# Patient Record
Sex: Female | Born: 1955 | Race: Black or African American | Hispanic: No | Marital: Married | Smoking: Current every day smoker
Health system: Southern US, Community
[De-identification: ages and names within clinical notes are randomized; demographics above are authoritative.]

## PROBLEM LIST (undated history)

## (undated) DIAGNOSIS — C801 Malignant (primary) neoplasm, unspecified: Secondary | ICD-10-CM

## (undated) DIAGNOSIS — C539 Malignant neoplasm of cervix uteri, unspecified: Secondary | ICD-10-CM

## (undated) DIAGNOSIS — R569 Unspecified convulsions: Secondary | ICD-10-CM

## (undated) DIAGNOSIS — I509 Heart failure, unspecified: Secondary | ICD-10-CM

## (undated) DIAGNOSIS — E785 Hyperlipidemia, unspecified: Secondary | ICD-10-CM

## (undated) DIAGNOSIS — I5181 Takotsubo syndrome: Secondary | ICD-10-CM

## (undated) DIAGNOSIS — C140 Malignant neoplasm of pharynx, unspecified: Secondary | ICD-10-CM

## (undated) DIAGNOSIS — F1010 Alcohol abuse, uncomplicated: Secondary | ICD-10-CM

## (undated) DIAGNOSIS — I639 Cerebral infarction, unspecified: Secondary | ICD-10-CM

## (undated) DIAGNOSIS — A0472 Enterocolitis due to Clostridium difficile, not specified as recurrent: Secondary | ICD-10-CM

## (undated) HISTORY — PX: CORONARY ARTERY BYPASS GRAFT: SHX141

---

## 2005-11-26 ENCOUNTER — Emergency Department: Payer: Self-pay | Admitting: Unknown Physician Specialty

## 2006-04-19 ENCOUNTER — Emergency Department: Payer: Self-pay | Admitting: Emergency Medicine

## 2008-02-22 ENCOUNTER — Other Ambulatory Visit: Payer: Self-pay

## 2008-02-22 ENCOUNTER — Inpatient Hospital Stay: Payer: Self-pay | Admitting: Internal Medicine

## 2008-02-28 ENCOUNTER — Other Ambulatory Visit: Payer: Self-pay

## 2008-03-14 ENCOUNTER — Other Ambulatory Visit: Payer: Self-pay

## 2008-03-15 ENCOUNTER — Inpatient Hospital Stay: Payer: Self-pay | Admitting: Cardiovascular Disease

## 2014-08-16 ENCOUNTER — Inpatient Hospital Stay: Payer: Self-pay | Admitting: Internal Medicine

## 2014-08-16 LAB — CBC WITH DIFFERENTIAL/PLATELET
BASOS ABS: 0.1 10*3/uL (ref 0.0–0.1)
Basophil %: 1.7 %
EOS ABS: 0 10*3/uL (ref 0.0–0.7)
Eosinophil %: 1.1 %
HCT: 36.5 % (ref 35.0–47.0)
HGB: 11.9 g/dL — ABNORMAL LOW (ref 12.0–16.0)
Lymphocyte #: 0.8 10*3/uL — ABNORMAL LOW (ref 1.0–3.6)
Lymphocyte %: 19.2 %
MCH: 35 pg — ABNORMAL HIGH (ref 26.0–34.0)
MCHC: 32.8 g/dL (ref 32.0–36.0)
MCV: 107 fL — AB (ref 80–100)
MONOS PCT: 8.9 %
Monocyte #: 0.4 x10 3/mm (ref 0.2–0.9)
Neutrophil #: 3 10*3/uL (ref 1.4–6.5)
Neutrophil %: 69.1 %
Platelet: 300 10*3/uL (ref 150–440)
RBC: 3.41 10*6/uL — AB (ref 3.80–5.20)
RDW: 14.6 % — ABNORMAL HIGH (ref 11.5–14.5)
WBC: 4.3 10*3/uL (ref 3.6–11.0)

## 2014-08-16 LAB — BASIC METABOLIC PANEL
ANION GAP: 10 (ref 7–16)
BUN: 11 mg/dL (ref 7–18)
CALCIUM: 8.9 mg/dL (ref 8.5–10.1)
Chloride: 105 mmol/L (ref 98–107)
Co2: 28 mmol/L (ref 21–32)
Creatinine: 1.06 mg/dL (ref 0.60–1.30)
EGFR (Non-African Amer.): 57 — ABNORMAL LOW
Glucose: 85 mg/dL (ref 65–99)
Osmolality: 284 (ref 275–301)
POTASSIUM: 4.1 mmol/L (ref 3.5–5.1)
Sodium: 143 mmol/L (ref 136–145)

## 2014-08-16 LAB — ETHANOL: Ethanol: 332 mg/dL

## 2014-08-16 LAB — PROTIME-INR
INR: 0.9
Prothrombin Time: 12.4 secs (ref 11.5–14.7)

## 2014-08-16 LAB — APTT: ACTIVATED PTT: 28.3 s (ref 23.6–35.9)

## 2014-08-17 LAB — CBC WITH DIFFERENTIAL/PLATELET
BASOS PCT: 0.9 %
Basophil #: 0.1 10*3/uL (ref 0.0–0.1)
Eosinophil #: 0 10*3/uL (ref 0.0–0.7)
Eosinophil %: 0.3 %
HCT: 31.1 % — AB (ref 35.0–47.0)
HGB: 10.5 g/dL — ABNORMAL LOW (ref 12.0–16.0)
LYMPHS ABS: 0.6 10*3/uL — AB (ref 1.0–3.6)
Lymphocyte %: 9.9 %
MCH: 35.9 pg — ABNORMAL HIGH (ref 26.0–34.0)
MCHC: 33.8 g/dL (ref 32.0–36.0)
MCV: 106 fL — ABNORMAL HIGH (ref 80–100)
Monocyte #: 0.7 x10 3/mm (ref 0.2–0.9)
Monocyte %: 11.2 %
Neutrophil #: 5 10*3/uL (ref 1.4–6.5)
Neutrophil %: 77.7 %
Platelet: 251 10*3/uL (ref 150–440)
RBC: 2.93 10*6/uL — ABNORMAL LOW (ref 3.80–5.20)
RDW: 14.8 % — AB (ref 11.5–14.5)
WBC: 6.4 10*3/uL (ref 3.6–11.0)

## 2014-08-17 LAB — BASIC METABOLIC PANEL
Anion Gap: 13 (ref 7–16)
BUN: 10 mg/dL (ref 7–18)
CHLORIDE: 107 mmol/L (ref 98–107)
CREATININE: 0.86 mg/dL (ref 0.60–1.30)
Calcium, Total: 8.3 mg/dL — ABNORMAL LOW (ref 8.5–10.1)
Co2: 24 mmol/L (ref 21–32)
EGFR (African American): >60
Glucose: 64 mg/dL — ABNORMAL LOW (ref 65–99)
Osmolality: 284 (ref 275–301)
POTASSIUM: 3.8 mmol/L (ref 3.5–5.1)
Sodium: 144 mmol/L (ref 136–145)

## 2014-08-17 LAB — HEPATIC FUNCTION PANEL A (ARMC)
AST: 33 U/L (ref 15–37)
Albumin: 3.3 g/dL — ABNORMAL LOW (ref 3.4–5.0)
Alkaline Phosphatase: 62 U/L
Bilirubin, Direct: 0.1 mg/dL (ref 0.0–0.2)
Bilirubin,Total: 0.7 mg/dL (ref 0.2–1.0)
SGPT (ALT): 22 U/L
Total Protein: 7.4 g/dL (ref 6.4–8.2)

## 2014-08-18 LAB — CBC WITH DIFFERENTIAL/PLATELET
BASOS ABS: 0 10*3/uL (ref 0.0–0.1)
Basophil %: 0.4 %
EOS ABS: 0 10*3/uL (ref 0.0–0.7)
Eosinophil %: 0.1 %
HCT: 28.3 % — ABNORMAL LOW (ref 35.0–47.0)
HGB: 9.7 g/dL — ABNORMAL LOW (ref 12.0–16.0)
Lymphocyte #: 0.5 10*3/uL — ABNORMAL LOW (ref 1.0–3.6)
Lymphocyte %: 7.9 %
MCH: 35.9 pg — AB (ref 26.0–34.0)
MCHC: 34.1 g/dL (ref 32.0–36.0)
MCV: 105 fL — AB (ref 80–100)
Monocyte #: 0.6 x10 3/mm (ref 0.2–0.9)
Monocyte %: 9.4 %
NEUTROS ABS: 5.3 10*3/uL (ref 1.4–6.5)
Neutrophil %: 82.2 %
Platelet: 193 10*3/uL (ref 150–440)
RBC: 2.69 10*6/uL — ABNORMAL LOW (ref 3.80–5.20)
RDW: 14.5 % (ref 11.5–14.5)
WBC: 6.4 10*3/uL (ref 3.6–11.0)

## 2014-08-18 LAB — BASIC METABOLIC PANEL
Anion Gap: 9 (ref 7–16)
BUN: 9 mg/dL (ref 7–18)
Calcium, Total: 8.4 mg/dL — ABNORMAL LOW (ref 8.5–10.1)
Chloride: 97 mmol/L — ABNORMAL LOW (ref 98–107)
Co2: 29 mmol/L (ref 21–32)
Creatinine: 1.13 mg/dL (ref 0.60–1.30)
EGFR (African American): >60
EGFR (Non-African Amer.): 53 — ABNORMAL LOW
GLUCOSE: 89 mg/dL (ref 65–99)
Osmolality: 268 (ref 275–301)
Potassium: 3.6 mmol/L (ref 3.5–5.1)
SODIUM: 135 mmol/L — AB (ref 136–145)

## 2014-08-19 LAB — BASIC METABOLIC PANEL
ANION GAP: 11 (ref 7–16)
BUN: 7 mg/dL (ref 7–18)
CALCIUM: 8.4 mg/dL — AB (ref 8.5–10.1)
Chloride: 96 mmol/L — ABNORMAL LOW (ref 98–107)
Co2: 27 mmol/L (ref 21–32)
Creatinine: 0.74 mg/dL (ref 0.60–1.30)
EGFR (African American): >60
GLUCOSE: 73 mg/dL (ref 65–99)
OSMOLALITY: 265 (ref 275–301)
POTASSIUM: 3.5 mmol/L (ref 3.5–5.1)
SODIUM: 134 mmol/L — AB (ref 136–145)

## 2014-08-19 LAB — HEMOGLOBIN: HGB: 10 g/dL — AB (ref 12.0–16.0)

## 2014-08-19 LAB — PHOSPHORUS: PHOSPHORUS: 1.7 mg/dL — AB (ref 2.5–4.9)

## 2014-08-19 LAB — MAGNESIUM: MAGNESIUM: 1.6 mg/dL — AB

## 2014-08-20 LAB — MAGNESIUM: MAGNESIUM: 1.7 mg/dL — AB

## 2014-08-20 LAB — PHOSPHORUS: Phosphorus: 3.1 mg/dL (ref 2.5–4.9)

## 2014-08-21 LAB — MAGNESIUM: MAGNESIUM: 1.8 mg/dL

## 2014-10-19 ENCOUNTER — Inpatient Hospital Stay: Payer: Self-pay | Admitting: Internal Medicine

## 2014-10-19 DIAGNOSIS — I517 Cardiomegaly: Secondary | ICD-10-CM

## 2014-10-19 LAB — CBC
HCT: 35.1 % (ref 35.0–47.0)
HGB: 11.6 g/dL — AB (ref 12.0–16.0)
MCH: 32.3 pg (ref 26.0–34.0)
MCHC: 33 g/dL (ref 32.0–36.0)
MCV: 98 fL (ref 80–100)
Platelet: 230 10*3/uL (ref 150–440)
RBC: 3.58 10*6/uL — AB (ref 3.80–5.20)
RDW: 15.6 % — ABNORMAL HIGH (ref 11.5–14.5)
WBC: 4.3 10*3/uL (ref 3.6–11.0)

## 2014-10-19 LAB — BASIC METABOLIC PANEL
Anion Gap: 11 (ref 7–16)
BUN: 12 mg/dL (ref 7–18)
Calcium, Total: 9 mg/dL (ref 8.5–10.1)
Chloride: 98 mmol/L (ref 98–107)
Co2: 25 mmol/L (ref 21–32)
Creatinine: 1.18 mg/dL (ref 0.60–1.30)
EGFR (African American): >60
GFR CALC NON AF AMER: 50 — AB
Glucose: 80 mg/dL (ref 65–99)
Osmolality: 267 (ref 275–301)
Potassium: 3.8 mmol/L (ref 3.5–5.1)
Sodium: 134 mmol/L — ABNORMAL LOW (ref 136–145)

## 2014-10-19 LAB — HEPATIC FUNCTION PANEL A (ARMC)
Albumin: 3.5 g/dL (ref 3.4–5.0)
Alkaline Phosphatase: 70 U/L
BILIRUBIN DIRECT: 0.1 mg/dL (ref 0.0–0.2)
BILIRUBIN TOTAL: 0.6 mg/dL (ref 0.2–1.0)
SGOT(AST): 37 U/L (ref 15–37)
SGPT (ALT): 21 U/L
Total Protein: 7.3 g/dL (ref 6.4–8.2)

## 2014-10-19 LAB — ETHANOL: Ethanol: <3 mg/dL

## 2014-10-19 LAB — MAGNESIUM: MAGNESIUM: 1.4 mg/dL — AB

## 2014-10-20 ENCOUNTER — Other Ambulatory Visit: Payer: Self-pay | Admitting: Physician Assistant

## 2014-10-20 DIAGNOSIS — E876 Hypokalemia: Secondary | ICD-10-CM

## 2014-10-20 DIAGNOSIS — I639 Cerebral infarction, unspecified: Secondary | ICD-10-CM

## 2014-10-20 DIAGNOSIS — I10 Essential (primary) hypertension: Secondary | ICD-10-CM

## 2014-10-20 LAB — LIPID PANEL
CHOLESTEROL: 189 mg/dL (ref 0–200)
HDL Cholesterol: 106 mg/dL — ABNORMAL HIGH (ref 40–60)
LDL CHOLESTEROL, CALC: 70 mg/dL (ref 0–100)
Triglycerides: 67 mg/dL (ref 0–200)
VLDL Cholesterol, Calc: 13 mg/dL (ref 5–40)

## 2014-10-20 LAB — TSH: Thyroid Stimulating Horm: 3.67 u[IU]/mL

## 2014-10-20 LAB — HEMOGLOBIN A1C: Hemoglobin A1C: 4.8 % (ref 4.2–6.3)

## 2014-10-20 LAB — MAGNESIUM: MAGNESIUM: 2.2 mg/dL

## 2014-10-21 LAB — BASIC METABOLIC PANEL
ANION GAP: 8 (ref 7–16)
BUN: 5 mg/dL — AB (ref 7–18)
CREATININE: 0.78 mg/dL (ref 0.60–1.30)
Calcium, Total: 8.9 mg/dL (ref 8.5–10.1)
Chloride: 104 mmol/L (ref 98–107)
Co2: 27 mmol/L (ref 21–32)
EGFR (African American): >60
EGFR (Non-African Amer.): >60
Glucose: 96 mg/dL (ref 65–99)
Osmolality: 275 (ref 275–301)
Potassium: 3.6 mmol/L (ref 3.5–5.1)
Sodium: 139 mmol/L (ref 136–145)

## 2014-10-21 LAB — HEMOGLOBIN: HGB: 10.7 g/dL — AB (ref 12.0–16.0)

## 2014-10-26 ENCOUNTER — Encounter: Payer: Self-pay | Admitting: Cardiovascular Disease

## 2014-10-27 ENCOUNTER — Encounter: Payer: Medicare Other | Admitting: Cardiovascular Disease

## 2014-11-13 ENCOUNTER — Ambulatory Visit: Payer: Self-pay | Admitting: Internal Medicine

## 2014-11-17 ENCOUNTER — Encounter: Payer: Self-pay | Admitting: *Deleted

## 2014-11-17 ENCOUNTER — Encounter: Payer: Medicare Other | Admitting: Cardiovascular Disease

## 2014-11-20 ENCOUNTER — Inpatient Hospital Stay: Payer: Self-pay | Admitting: Internal Medicine

## 2014-11-20 LAB — CBC
HCT: 38 % (ref 35.0–47.0)
HGB: 12.1 g/dL (ref 12.0–16.0)
MCH: 30.4 pg (ref 26.0–34.0)
MCHC: 31.9 g/dL — ABNORMAL LOW (ref 32.0–36.0)
MCV: 95 fL (ref 80–100)
Platelet: 292 10*3/uL (ref 150–440)
RBC: 3.98 10*6/uL (ref 3.80–5.20)
RDW: 16.9 % — ABNORMAL HIGH (ref 11.5–14.5)
WBC: 13.2 10*3/uL — ABNORMAL HIGH (ref 3.6–11.0)

## 2014-11-20 LAB — URINALYSIS, COMPLETE
BACTERIA: NONE SEEN
Bilirubin,UR: NEGATIVE
GLUCOSE, UR: NEGATIVE mg/dL (ref 0–75)
Hyaline Cast: 8
Ketone: NEGATIVE
LEUKOCYTE ESTERASE: NEGATIVE
NITRITE: NEGATIVE
Ph: 5 (ref 4.5–8.0)
Protein: 100
RBC,UR: 1 /HPF (ref 0–5)
SPECIFIC GRAVITY: 1.014 (ref 1.003–1.030)

## 2014-11-20 LAB — COMPREHENSIVE METABOLIC PANEL
ALT: 14 U/L (ref 14–63)
AST: 34 U/L (ref 15–37)
Albumin: 3.4 g/dL (ref 3.4–5.0)
Alkaline Phosphatase: 77 U/L (ref 46–116)
Anion Gap: 12 (ref 7–16)
BILIRUBIN TOTAL: 0.5 mg/dL (ref 0.2–1.0)
BUN: 14 mg/dL (ref 7–18)
CHLORIDE: 100 mmol/L (ref 98–107)
CREATININE: 1.42 mg/dL — AB (ref 0.60–1.30)
Calcium, Total: 9.1 mg/dL (ref 8.5–10.1)
Co2: 25 mmol/L (ref 21–32)
EGFR (African American): 49 — ABNORMAL LOW
GFR CALC NON AF AMER: 41 — AB
Glucose: 170 mg/dL — ABNORMAL HIGH (ref 65–99)
Osmolality: 278 (ref 275–301)
Potassium: 3.3 mmol/L — ABNORMAL LOW (ref 3.5–5.1)
Sodium: 137 mmol/L (ref 136–145)
Total Protein: 8.2 g/dL (ref 6.4–8.2)

## 2014-11-20 LAB — DRUG SCREEN, URINE
Amphetamines, Ur Screen: NEGATIVE (ref ?–1000)
Barbiturates, Ur Screen: NEGATIVE (ref ?–200)
Benzodiazepine, Ur Scrn: NEGATIVE (ref ?–200)
Cannabinoid 50 Ng, Ur ~~LOC~~: NEGATIVE (ref ?–50)
Cocaine Metabolite,Ur ~~LOC~~: NEGATIVE (ref ?–300)
MDMA (ECSTASY) UR SCREEN: NEGATIVE (ref ?–500)
Methadone, Ur Screen: NEGATIVE (ref ?–300)
Opiate, Ur Screen: NEGATIVE (ref ?–300)
Phencyclidine (PCP) Ur S: NEGATIVE (ref ?–25)
Tricyclic, Ur Screen: NEGATIVE (ref ?–1000)

## 2014-11-20 LAB — ACETAMINOPHEN LEVEL: Acetaminophen: <2 ug/mL

## 2014-11-20 LAB — PROTIME-INR
INR: 1
PROTHROMBIN TIME: 13.7 s

## 2014-11-20 LAB — SALICYLATE LEVEL: Salicylates, Serum: 6.5 mg/dL — ABNORMAL HIGH

## 2014-11-20 LAB — ETHANOL

## 2014-11-20 LAB — MAGNESIUM: Magnesium: 1.6 mg/dL — ABNORMAL LOW

## 2014-11-20 LAB — TROPONIN I: TROPONIN-I: 0.04 ng/mL

## 2014-11-22 DIAGNOSIS — I340 Nonrheumatic mitral (valve) insufficiency: Secondary | ICD-10-CM

## 2014-11-24 ENCOUNTER — Other Ambulatory Visit (HOSPITAL_COMMUNITY): Payer: Self-pay

## 2014-11-24 ENCOUNTER — Inpatient Hospital Stay
Admission: AD | Admit: 2014-11-24 | Discharge: 2015-01-01 | Disposition: A | Discharge status: Skilled Nursing Facility | Payer: Self-pay | Source: Ambulatory Visit | Attending: Internal Medicine | Admitting: Internal Medicine

## 2014-11-24 ENCOUNTER — Ambulatory Visit (HOSPITAL_COMMUNITY)
Admission: AD | Admit: 2014-11-24 | Discharge: 2014-11-24 | Disposition: A | Discharge status: Home / Self Care | Payer: Medicare Other | Source: Other Acute Inpatient Hospital | Attending: Internal Medicine | Admitting: Internal Medicine

## 2014-11-24 DIAGNOSIS — Z978 Presence of other specified devices: Secondary | ICD-10-CM

## 2014-11-24 DIAGNOSIS — Z9911 Dependence on respirator [ventilator] status: Secondary | ICD-10-CM | POA: Diagnosis not present

## 2014-11-24 DIAGNOSIS — Z01818 Encounter for other preprocedural examination: Secondary | ICD-10-CM

## 2014-11-24 DIAGNOSIS — J9601 Acute respiratory failure with hypoxia: Secondary | ICD-10-CM

## 2014-11-24 DIAGNOSIS — Z931 Gastrostomy status: Secondary | ICD-10-CM

## 2014-11-24 DIAGNOSIS — K639 Disease of intestine, unspecified: Secondary | ICD-10-CM

## 2014-11-24 DIAGNOSIS — K6389 Other specified diseases of intestine: Secondary | ICD-10-CM

## 2014-11-24 DIAGNOSIS — Z431 Encounter for attention to gastrostomy: Secondary | ICD-10-CM

## 2014-11-24 DIAGNOSIS — J9690 Respiratory failure, unspecified, unspecified whether with hypoxia or hypercapnia: Secondary | ICD-10-CM | POA: Diagnosis present

## 2014-11-24 DIAGNOSIS — Z430 Encounter for attention to tracheostomy: Secondary | ICD-10-CM

## 2014-11-24 DIAGNOSIS — R1310 Dysphagia, unspecified: Secondary | ICD-10-CM | POA: Insufficient documentation

## 2014-11-24 HISTORY — DX: Heart failure, unspecified: I50.9

## 2014-11-24 HISTORY — DX: Alcohol abuse, uncomplicated: F10.10

## 2014-11-24 HISTORY — DX: Cerebral infarction, unspecified: I63.9

## 2014-11-24 HISTORY — DX: Takotsubo syndrome: I51.81

## 2014-11-25 LAB — CBC
HEMATOCRIT: 26.4 % — AB (ref 36.0–46.0)
Hemoglobin: 9.4 g/dL — ABNORMAL LOW (ref 12.0–15.0)
MCH: 30.5 pg (ref 26.0–34.0)
MCHC: 35.6 g/dL (ref 30.0–36.0)
MCV: 85.7 fL (ref 78.0–100.0)
Platelets: 181 10*3/uL (ref 150–400)
RBC: 3.08 MIL/uL — ABNORMAL LOW (ref 3.87–5.11)
RDW: 16.2 % — ABNORMAL HIGH (ref 11.5–15.5)
WBC: 8.2 10*3/uL (ref 4.0–10.5)

## 2014-11-25 LAB — TSH: TSH: 1.833 u[IU]/mL (ref 0.350–4.500)

## 2014-11-25 LAB — BLOOD GAS, ARTERIAL
ACID-BASE EXCESS: 4 mmol/L — AB (ref 0.0–2.0)
Acid-Base Excess: 2.4 mmol/L — ABNORMAL HIGH (ref 0.0–2.0)
Acid-Base Excess: 2.8 mmol/L — ABNORMAL HIGH (ref 0.0–2.0)
BICARBONATE: 25.8 meq/L — AB (ref 20.0–24.0)
Bicarbonate: 25.5 mEq/L — ABNORMAL HIGH (ref 20.0–24.0)
Bicarbonate: 27.2 mEq/L — ABNORMAL HIGH (ref 20.0–24.0)
FIO2: 0.3 %
FIO2: 0.3 %
FIO2: 0.3 %
MECHVT: 450 mL
MECHVT: 400 mL
O2 SAT: 97 %
O2 SAT: 99 %
O2 Saturation: 82.4 %
PCO2 ART: 35.4 mmHg (ref 35.0–45.0)
PCO2 ART: 36.8 mmHg (ref 35.0–45.0)
PEEP/CPAP: 5 cmH2O
PEEP/CPAP: 5 cmH2O
PEEP: 5 cmH2O
PH ART: 7.529 — AB (ref 7.350–7.450)
Patient temperature: 98.6
Patient temperature: 98.7
Patient temperature: 100
RATE: 15 resp/min
RATE: 15 resp/min
RATE: 15 resp/min
TCO2: 26.9 mmol/L (ref 0–100)
TCO2: 26.5 mmol/L (ref 0–100)
TCO2: 28.3 mmol/L (ref 0–100)
VT: 450 mL
pCO2 arterial: 30.8 mmHg — ABNORMAL LOW (ref 35.0–45.0)
pH, Arterial: 7.476 — ABNORMAL HIGH (ref 7.350–7.450)
pH, Arterial: 7.486 — ABNORMAL HIGH (ref 7.350–7.450)
pO2, Arterial: 48.4 mmHg — ABNORMAL LOW (ref 80.0–100.0)
pO2, Arterial: 83.5 mmHg (ref 80.0–100.0)
pO2, Arterial: 97.8 mmHg (ref 80.0–100.0)

## 2014-11-25 LAB — CBC WITH DIFFERENTIAL/PLATELET
BASOS PCT: 0 % (ref 0–1)
Basophils Absolute: 0 10*3/uL (ref 0.0–0.1)
EOS ABS: 0 10*3/uL (ref 0.0–0.7)
Eosinophils Relative: 1 % (ref 0–5)
HEMATOCRIT: 27.3 % — AB (ref 36.0–46.0)
Hemoglobin: 9.7 g/dL — ABNORMAL LOW (ref 12.0–15.0)
LYMPHS ABS: 0.7 10*3/uL (ref 0.7–4.0)
LYMPHS PCT: 9 % — AB (ref 12–46)
MCH: 30.3 pg (ref 26.0–34.0)
MCHC: 35.5 g/dL (ref 30.0–36.0)
MCV: 85.3 fL (ref 78.0–100.0)
MONO ABS: 1.6 10*3/uL — AB (ref 0.1–1.0)
Monocytes Relative: 19 % — ABNORMAL HIGH (ref 3–12)
NEUTROS PCT: 71 % (ref 43–77)
Neutro Abs: 5.9 10*3/uL (ref 1.7–7.7)
PLATELETS: 197 10*3/uL (ref 150–400)
RBC: 3.2 MIL/uL — ABNORMAL LOW (ref 3.87–5.11)
RDW: 15.8 % — ABNORMAL HIGH (ref 11.5–15.5)
WBC: 8.2 10*3/uL (ref 4.0–10.5)

## 2014-11-25 LAB — BASIC METABOLIC PANEL
ANION GAP: 9 (ref 5–15)
ANION GAP: 12 (ref 5–15)
BUN: 7 mg/dL (ref 6–23)
BUN: <5 mg/dL — ABNORMAL LOW (ref 6–23)
CO2: 25 mmol/L (ref 19–32)
CO2: 24 mmol/L (ref 19–32)
CREATININE: 0.9 mg/dL (ref 0.50–1.10)
Calcium: 7.9 mg/dL — ABNORMAL LOW (ref 8.4–10.5)
Calcium: 8.5 mg/dL (ref 8.4–10.5)
Chloride: 99 mmol/L (ref 96–112)
Chloride: 99 mmol/L (ref 96–112)
Creatinine, Ser: 0.95 mg/dL (ref 0.50–1.10)
GFR calc Af Amer: 76 mL/min — ABNORMAL LOW (ref >90)
GFR calc Af Amer: 81 mL/min — ABNORMAL LOW (ref >90)
GFR, EST NON AFRICAN AMERICAN: 66 mL/min — AB (ref >90)
GFR, EST NON AFRICAN AMERICAN: 70 mL/min — AB (ref >90)
GLUCOSE: 114 mg/dL — AB (ref 70–99)
Glucose, Bld: 118 mg/dL — ABNORMAL HIGH (ref 70–99)
POTASSIUM: 3.7 mmol/L (ref 3.5–5.1)
Potassium: 3.4 mmol/L — ABNORMAL LOW (ref 3.5–5.1)
SODIUM: 135 mmol/L (ref 135–145)
Sodium: 133 mmol/L — ABNORMAL LOW (ref 135–145)

## 2014-11-25 LAB — HEPATIC FUNCTION PANEL
ALBUMIN: 2 g/dL — AB (ref 3.5–5.2)
ALT: 10 U/L (ref 0–35)
AST: 26 U/L (ref 0–37)
Alkaline Phosphatase: 49 U/L (ref 39–117)
BILIRUBIN DIRECT: 0.2 mg/dL (ref 0.0–0.5)
BILIRUBIN INDIRECT: 0.5 mg/dL (ref 0.3–0.9)
Total Bilirubin: 0.7 mg/dL (ref 0.3–1.2)
Total Protein: 5.3 g/dL — ABNORMAL LOW (ref 6.0–8.3)

## 2014-11-25 LAB — LACTIC ACID, PLASMA: Lactic Acid, Venous: 1.8 mmol/L (ref 0.5–2.0)

## 2014-11-25 LAB — BRAIN NATRIURETIC PEPTIDE: B NATRIURETIC PEPTIDE 5: 1791.9 pg/mL — AB (ref 0.0–100.0)

## 2014-11-25 LAB — PROCALCITONIN: PROCALCITONIN: 0.57 ng/mL

## 2014-11-26 ENCOUNTER — Other Ambulatory Visit (HOSPITAL_COMMUNITY): Payer: Self-pay

## 2014-11-26 LAB — URINALYSIS, ROUTINE W REFLEX MICROSCOPIC
BILIRUBIN URINE: NEGATIVE
GLUCOSE, UA: NEGATIVE mg/dL
Hgb urine dipstick: NEGATIVE
KETONES UR: NEGATIVE mg/dL
LEUKOCYTES UA: NEGATIVE
Nitrite: NEGATIVE
PH: 7.5 (ref 5.0–8.0)
PROTEIN: NEGATIVE mg/dL
SPECIFIC GRAVITY, URINE: 1.007 (ref 1.005–1.030)
Urobilinogen, UA: 0.2 mg/dL (ref 0.0–1.0)

## 2014-11-26 LAB — VITAMIN B12: VITAMIN B 12: 1269 pg/mL — AB (ref 211–911)

## 2014-11-27 LAB — URINE CULTURE
Colony Count: NO GROWTH
Culture: NO GROWTH

## 2014-11-27 LAB — CULTURE, RESPIRATORY: GRAM STAIN: NONE SEEN

## 2014-11-27 LAB — BLOOD GAS, ARTERIAL
Acid-Base Excess: 2.1 mmol/L — ABNORMAL HIGH (ref 0.0–2.0)
Bicarbonate: 25.5 mEq/L — ABNORMAL HIGH (ref 20.0–24.0)
FIO2: 0.28 %
LHR: 15 {breaths}/min
O2 Saturation: 98.3 %
PEEP/CPAP: 5 cmH2O
Patient temperature: 98.6
TCO2: 26.6 mmol/L (ref 0–100)
VT: 400 mL
pCO2 arterial: 35.9 mmHg (ref 35.0–45.0)
pH, Arterial: 7.466 — ABNORMAL HIGH (ref 7.350–7.450)
pO2, Arterial: 102 mmHg — ABNORMAL HIGH (ref 80.0–100.0)

## 2014-11-27 LAB — CBC WITH DIFFERENTIAL/PLATELET
BASOS PCT: 0 % (ref 0–1)
Basophils Absolute: 0 10*3/uL (ref 0.0–0.1)
EOS PCT: 2 % (ref 0–5)
Eosinophils Absolute: 0.1 10*3/uL (ref 0.0–0.7)
HCT: 26.6 % — ABNORMAL LOW (ref 36.0–46.0)
HEMOGLOBIN: 9.2 g/dL — AB (ref 12.0–15.0)
LYMPHS PCT: 12 % (ref 12–46)
Lymphs Abs: 1 10*3/uL (ref 0.7–4.0)
MCH: 30.5 pg (ref 26.0–34.0)
MCHC: 34.6 g/dL (ref 30.0–36.0)
MCV: 88.1 fL (ref 78.0–100.0)
MONOS PCT: 17 % — AB (ref 3–12)
Monocytes Absolute: 1.4 10*3/uL — ABNORMAL HIGH (ref 0.1–1.0)
NEUTROS PCT: 69 % (ref 43–77)
Neutro Abs: 5.4 10*3/uL (ref 1.7–7.7)
PLATELETS: 243 10*3/uL (ref 150–400)
RBC: 3.02 MIL/uL — AB (ref 3.87–5.11)
RDW: 16.3 % — ABNORMAL HIGH (ref 11.5–15.5)
WBC: 7.9 10*3/uL (ref 4.0–10.5)

## 2014-11-27 LAB — CULTURE, RESPIRATORY W GRAM STAIN

## 2014-11-27 LAB — BASIC METABOLIC PANEL
Anion gap: 6 (ref 5–15)
CO2: 27 mmol/L (ref 19–32)
Calcium: 8.2 mg/dL — ABNORMAL LOW (ref 8.4–10.5)
Chloride: 103 mmol/L (ref 96–112)
Creatinine, Ser: 0.73 mg/dL (ref 0.50–1.10)
GFR calc Af Amer: >90 mL/min (ref >90)
GFR calc non Af Amer: >90 mL/min (ref >90)
Glucose, Bld: 90 mg/dL (ref 70–99)
Potassium: 2.9 mmol/L — ABNORMAL LOW (ref 3.5–5.1)
Sodium: 136 mmol/L (ref 135–145)

## 2014-11-28 ENCOUNTER — Encounter: Payer: Self-pay | Admitting: Pulmonary Disease

## 2014-11-28 ENCOUNTER — Other Ambulatory Visit (HOSPITAL_COMMUNITY): Payer: Self-pay

## 2014-11-28 DIAGNOSIS — J690 Pneumonitis due to inhalation of food and vomit: Secondary | ICD-10-CM

## 2014-11-28 LAB — BASIC METABOLIC PANEL
Anion gap: 13 (ref 5–15)
BUN: <5 mg/dL — ABNORMAL LOW (ref 6–23)
CO2: 19 mmol/L (ref 19–32)
Calcium: 8.3 mg/dL — ABNORMAL LOW (ref 8.4–10.5)
Chloride: 107 mmol/L (ref 96–112)
Creatinine, Ser: 0.69 mg/dL (ref 0.50–1.10)
GFR calc Af Amer: >90 mL/min (ref >90)
Glucose, Bld: 82 mg/dL (ref 70–99)
POTASSIUM: 3.9 mmol/L (ref 3.5–5.1)
SODIUM: 139 mmol/L (ref 135–145)

## 2014-11-28 LAB — VANCOMYCIN, TROUGH: Vancomycin Tr: 13.8 ug/mL (ref 10.0–20.0)

## 2014-11-28 NOTE — Consult Note (Signed)
Name: [name] MRN: [id number] DOB: 03/22/1956    ADMISSION DATE:  11/24/2014 CONSULTATION DATE:  11/28/2014  REFERRING MD :  Dr. Shawnie Sanders  CHIEF COMPLAINT: VDRF  BRIEF PATIENT DESCRIPTION: 59 year old female from [facility]. Admitted 11/20/14 for seizure activity and respritary failure. Found to have CVA and aspiration PNA. Required mechanical ventilation. She had continued encephalopathy due to CVA and was unable to be weaned from ventilator. Transferred to [facility] 2/12. PCCM consult 2/16.   SIGNIFICANT EVENTS  2/8 admitted to [facility] with seizures, CVA, respiratory failure 2nd to aspiration PNA. 2/10 Intubated for respiratory insufficiency 2/12 Transferred to [facility] 2/16 PCCM consult.   STUDIES:  2/9 MRI brain > evolution of right parietal infarct, now with encephalomalacia and laminar necrosis. New small acute nonhemorrhagic R cerebellar infarct. Several other remote areas of infarction.  2/10 echo> LVEF 30-35%, severe hypokinesis of anteroseptal and apical wall, with some degree of diastolic dysfunction.    HISTORY OF PRESENT ILLNESS:  59 year old female with PMH of ETOH abuse, multiple CVA, HTN, GERD, and MVR (bovine). She was admitted to [facility] 2/8 after having episodes on seizure at home. She was found to be altered and MRI was obtained and showed acute stroke. She was also in respiratory distress. CXR showed bilateral opacifications, presumed to be secondary to aspiration event. She was intubated and admitted to ICU. Placed on Clinda for this. There was also some concern for pulmonary edema, so Echo was obtained and showed LVEF 30-35%, which is in sharp contrast to her echo in January which showed LVEF 55-60%. 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   While inpatient she was placed on keppra for suspected alcohol related seizures. Neurology evaluation and EEG revealed no active seizures. She was intubated on 2/10 for acute hypoxemic respiratory failure 2nd to aspiration PNA. She was  started on clindamycin for this. Sputum cultures described GNR. She has also been receiving diuresis on an as needed basis in setting of pulmonary edema from systolic CHF. She was unable to wean from the ventilator and on 2/12 she was transferred to [facility] for additional management. 2/16 PCCM consulted.   PAST MEDICAL HISTORY :   has a past medical history of ETOH abuse; CVA (cerebral infarction); Acute CHF (congestive heart failure); and Stress-induced cardiomyopathy.  has no past surgical history on file. Prior to Admission medications   Not on File   Allergies not on file  FAMILY HISTORY:  family history is not on file. SOCIAL HISTORY:   REVIEW OF SYSTEMS:   Unable due to encephalopathy   SUBJECTIVE:   VITAL SIGNS:   T-98, HR 101, RR 18, BP 111/99  PHYSICAL EXAMINATION: General:  Female of normal body habitus, agitated on vent Neuro:  Agitated, will not arouse, does not follow commands.  HEENT:  [place]/AT, no JVD noted Cardiovascular:  RRR no MRG Lungs:  Coarse bilateral breath sounds.  Abdomen:  Soft, non-tender, non-distended Musculoskeletal:  No acute deformity, moves all extremities.  Skin:  Grossly intact   Recent Labs Lab 11/25/14 1451 11/27/14 1026 11/28/14 0710  NA 135 136 139  K 3.4* 2.9* 3.9  CL 99 103 107  CO2 24 27 19   BUN <5* <5* <5*  CREATININE 0.90 0.73 0.69  GLUCOSE 114* 90 82    Recent Labs Lab 11/25/14 0630 11/25/14 1451 11/27/14 1026  HGB 9.4* 9.7* 9.2*  HCT 26.4* 27.3* 26.6*  WBC 8.2 8.2 7.9  PLT 181 197 243   No results found.  ASSESSMENT / PLAN:  Ventilatory Dependent Respiratory Failure r/t aspiration PNA, Pulmonary edema Acute systolic/diastolic CHF - Continue full vent support - SBT per [facility] protocol - Repeat CXR - Continue ceftriaxone - Check BNP - Keep volume negative. Depending on CXR results, BNP may want to consider diuresis. - Would defer extubation until more alert - Will need repeat echo at some point   ETOH  abuse Seizures Acute CVA - Management per primary team   [name], AGACNP-BC [place] Pulmonology/Critical Care Pager [phone] or [phone]   Reviewed above, examined.  59 yo female admitted to [facility] with seizure, CVA, aspiration PNA, and VDRF.  She failed to wean from vent and transferred to [facility].  He has hx of ETOH, systolic CHF and bovine MVR.    She is intubated, and agitated.  She has b/l rales, abd soft, 2/6 SM.  Abx per primary team, diurese as tolerated.  Will try pressure support weaning as tolerated >> she had difficulty tolerating 12/5 PS overnight, and has significant amount of secretions.  CC time 35 minutes by me independent of APP time.  Jean Mires, MD [facility] Pulmonary/Critical Care 11/28/2014, 12:57 PM Pager:  [phone] After 3pm call: [phone]

## 2014-11-29 ENCOUNTER — Other Ambulatory Visit (HOSPITAL_COMMUNITY): Payer: Self-pay

## 2014-11-29 DIAGNOSIS — J9601 Acute respiratory failure with hypoxia: Secondary | ICD-10-CM

## 2014-11-29 DIAGNOSIS — Z01818 Encounter for other preprocedural examination: Secondary | ICD-10-CM

## 2014-11-29 DIAGNOSIS — Z9911 Dependence on respirator [ventilator] status: Secondary | ICD-10-CM

## 2014-11-29 LAB — BLOOD GAS, ARTERIAL
Acid-Base Excess: 0.7 mmol/L (ref 0.0–2.0)
Acid-base deficit: 1.9 mmol/L (ref 0.0–2.0)
Bicarbonate: 24.3 mEq/L — ABNORMAL HIGH (ref 20.0–24.0)
Bicarbonate: 23.3 mEq/L (ref 20.0–24.0)
FIO2: 0.28 %
FIO2: 0.3 %
LHR: 24 {breaths}/min
LHR: 14 {breaths}/min
MECHVT: 320 mL
MECHVT: 450 mL
O2 SAT: 79.9 %
O2 Saturation: 97.2 %
PATIENT TEMPERATURE: 98.6
PEEP/CPAP: 5 cmH2O
PEEP: 5 cmH2O
PH ART: 7.446 (ref 7.350–7.450)
PH ART: 7.318 — AB (ref 7.350–7.450)
Patient temperature: 98.6
TCO2: 25.4 mmol/L (ref 0–100)
TCO2: 24.7 mmol/L (ref 0–100)
pCO2 arterial: 35.9 mmHg (ref 35.0–45.0)
pCO2 arterial: 46.8 mmHg — ABNORMAL HIGH (ref 35.0–45.0)
pO2, Arterial: 92.9 mmHg (ref 80.0–100.0)
pO2, Arterial: 53.7 mmHg — ABNORMAL LOW (ref 80.0–100.0)

## 2014-11-29 LAB — BRAIN NATRIURETIC PEPTIDE: B NATRIURETIC PEPTIDE 5: 2777.4 pg/mL — AB (ref 0.0–100.0)

## 2014-11-29 NOTE — Procedures (Signed)
Intubation Procedure Note [name] [id number] 21-Sep-1956  Procedure: Intubation Indications: Respiratory insufficiency  Procedure Details Consent: Unable to obtain consent because of emergent medical necessity. Time Out: Verified patient identification, verified procedure, site/side was marked, verified correct patient position, special equipment/implants available, medications/allergies/relevent history reviewed, required imaging and test results available.  Performed  Maximum sterile technique was used including cap, gloves, gown, hand hygiene and mask.  MAC    Evaluation Hemodynamic Status: BP stable throughout; O2 sats: needed lot of bagging prior to intubation to bring it up > 90% Patient's Current Condition: stable Complications: No apparent complications Patient did tolerate procedure well. Chest X-ray ordered to verify placement.  CXR: pending.   [name] 11/29/2014    Called stat to intubate. Patient extubated earlier and then resp distress. [facility] MD tried to intubate but failed. Patient then positioned, pre-oxygennated and using RSI etomidate, vec, fent , versed intubateted. Tube give was 6.5 and changed with Bougie to 7. ARTYENOIDS SWOLLEN. BLood in oral cavity + Easy intubatino though. Might need steroids due to observation of post extubation wheeze/stridoe  Dr. Brand Males, M.D., [facility].C.P Pulmonary and Critical Care Medicine Staff Physician [place] Pulmonary and Critical Care Pager: [phone], If no answer or between  15:00h - 7:00h: call [phone]  11/29/2014 12:37 PM

## 2014-11-29 NOTE — Progress Notes (Signed)
   Name: [name] MRN: [id number] DOB: Jun 11, 1956    ADMISSION DATE:  11/24/2014 CONSULTATION DATE:  11/28/2014  REFERRING MD :  Dr. Shawnie Pons  CHIEF COMPLAINT: VDRF  BRIEF PATIENT DESCRIPTION:  59 year old female from [facility]. Admitted 11/20/14 for seizure activity and respritary failure. Found to have CVA and aspiration PNA. Required mechanical ventilation. She had continued encephalopathy due to CVA and was unable to be weaned from ventilator. Transferred to [facility] 2/12. PCCM consult 2/16.   SIGNIFICANT EVENTS  2/8 admitted to [facility] with seizures, CVA, respiratory failure 2nd to aspiration PNA. 2/10 Intubated for respiratory insufficiency 2/12 Transferred to [facility] 2/16 PCCM consult.  2/17 passed sbt extubated  STUDIES:  2/9 MRI brain > evolution of right parietal infarct, now with encephalomalacia and laminar necrosis. New small acute nonhemorrhagic R cerebellar infarct. Several other remote areas of infarction.  2/10 echo> LVEF 30-35%, severe hypokinesis of anteroseptal and apical wall, with some degree of diastolic dysfunction.     SUBJECTIVE: passed SBT  VITAL SIGNS: Reviewed  PHYSICAL EXAMINATION: General:  Female of normal body habitus, calm. Not in distress on PSV. Was agitated earlier  Neuro:  Agitated at times. No focal def.  HEENT:  [place]/AT, no JVD noted. Orally intubated  Cardiovascular:  RRR no MRG Lungs:  Coarse bilateral breath sounds.  Abdomen:  Soft, non-tender, non-distended Musculoskeletal:  No acute deformity, moves all extremities.  Skin:  Grossly intact   Recent Labs Lab 11/25/14 1451 11/27/14 1026 11/28/14 0710  NA 135 136 139  K 3.4* 2.9* 3.9  CL 99 103 107  CO2 24 27 19   BUN <5* <5* <5*  CREATININE 0.90 0.73 0.69  GLUCOSE 114* 90 82    Recent Labs Lab 11/25/14 0630 11/25/14 1451 11/27/14 1026  HGB 9.4* 9.7* 9.2*  HCT 26.4* 27.3* 26.6*  WBC 8.2 8.2 7.9  PLT 181 197 243   Dg Chest Port 1 View  11/28/2014   CLINICAL DATA:  Respiratory failure   EXAM: PORTABLE CHEST - 1 VIEW  COMPARISON:  11/24/2014  FINDINGS: Endotracheal tube in good position. Central venous catheter tip in the region of the azygos vein unchanged. No pneumothorax.  Diffuse bilateral airspace disease shows mild progression consistent with pulmonary edema. Small pleural effusions and mild bibasilar atelectasis.  IMPRESSION: Progression of bilateral edema.  Small bilateral pleural effusions and mild bibasilar atelectasis left greater than right.   Electronically Signed   By: Franchot Gallo M.D.   On: 11/28/2014 12:49    ASSESSMENT / PLAN:  Ventilatory Dependent Respiratory Failure r/t aspiration PNA, Pulmonary edema and acute encephalopathy Acute systolic/diastolic CHF  Passed SBT. Awake this am.   Plan - extubate  - Continue ceftriaxone - Keep volume negative. Depending on CXR results, BNP may want to consider diuresis. - Will need repeat echo at some point   ETOH abuse Seizures Acute CVA - Management per primary team   Erick Colace ACNP-BC [place] Pager # [phone] OR # [phone] if no answer   Reviewed above, and examined.  She remains agitated, but has done better with SBT.  Lungs clearer and less secretions.  Hemodynamics otherwise stable.  She was extubated w/o difficulty.  Continue Abx per primary team.  CC time by me independent of APP time is 35 minutes.  Chesley Mires, MD [facility] Pulmonary/Critical Care 11/29/2014, 12:01 PM Pager:  [phone] After 3pm call: [phone]

## 2014-11-30 ENCOUNTER — Other Ambulatory Visit (HOSPITAL_COMMUNITY): Payer: Self-pay

## 2014-11-30 LAB — BLOOD GAS, ARTERIAL
ACID-BASE EXCESS: 3.7 mmol/L — AB (ref 0.0–2.0)
BICARBONATE: 26.7 meq/L — AB (ref 20.0–24.0)
FIO2: 0.4 %
MECHVT: 600 mL
O2 SAT: 98.3 %
PATIENT TEMPERATURE: 98.6
PEEP: 5 cmH2O
RATE: 12 resp/min
TCO2: 27.8 mmol/L (ref 0–100)
pCO2 arterial: 33.6 mmHg — ABNORMAL LOW (ref 35.0–45.0)
pH, Arterial: 7.512 — ABNORMAL HIGH (ref 7.350–7.450)
pO2, Arterial: 121 mmHg — ABNORMAL HIGH (ref 80.0–100.0)

## 2014-11-30 LAB — BASIC METABOLIC PANEL
ANION GAP: 9 (ref 5–15)
BUN: 7 mg/dL (ref 6–23)
CHLORIDE: 100 mmol/L (ref 96–112)
CO2: 29 mmol/L (ref 19–32)
Calcium: 8.3 mg/dL — ABNORMAL LOW (ref 8.4–10.5)
Creatinine, Ser: 0.83 mg/dL (ref 0.50–1.10)
GFR calc non Af Amer: 77 mL/min — ABNORMAL LOW (ref >90)
GFR, EST AFRICAN AMERICAN: 90 mL/min — AB (ref >90)
Glucose, Bld: 124 mg/dL — ABNORMAL HIGH (ref 70–99)
Potassium: 2.8 mmol/L — ABNORMAL LOW (ref 3.5–5.1)
SODIUM: 138 mmol/L (ref 135–145)

## 2014-11-30 LAB — CBC
HCT: 25.1 % — ABNORMAL LOW (ref 36.0–46.0)
HEMOGLOBIN: 8.6 g/dL — AB (ref 12.0–15.0)
MCH: 29.8 pg (ref 26.0–34.0)
MCHC: 34.3 g/dL (ref 30.0–36.0)
MCV: 86.9 fL (ref 78.0–100.0)
Platelets: 314 10*3/uL (ref 150–400)
RBC: 2.89 MIL/uL — AB (ref 3.87–5.11)
RDW: 16.2 % — ABNORMAL HIGH (ref 11.5–15.5)
WBC: 9.2 10*3/uL (ref 4.0–10.5)

## 2014-11-30 MED ORDER — ACETAMINOPHEN 160 MG/5ML PO SOLN: 650.0000 mg | Freq: Three times a day (TID) | ORAL | Status: DC | PRN

## 2014-11-30 NOTE — Progress Notes (Signed)
Name: [name] MRN: [id number] DOB: [birth year]    ADMISSION DATE:  11/24/2014 CONSULTATION DATE:  11/28/2014  REFERRING MD :  Dr. Shawnie Pons  CHIEF COMPLAINT: VDRF  BRIEF PATIENT DESCRIPTION:  59 year old female from [facility]. Admitted 11/20/14 for seizure activity and respritary failure. Found to have CVA and aspiration PNA. Required mechanical ventilation. She had continued encephalopathy due to CVA and was unable to be weaned from ventilator. Transferred to [facility] 2/12. PCCM consult 2/16.   SIGNIFICANT EVENTS  2/8 admitted to [facility] with seizures, CVA, respiratory failure 2nd to aspiration PNA. 2/10 Intubated for respiratory insufficiency 2/12 Transferred to [facility] 2/16 PCCM consult.  2/17 passed sbt extubated >> re-intubated; upper airway edema noted  STUDIES:  2/9 MRI brain > evolution of right parietal infarct, now with encephalomalacia and laminar necrosis. New small acute nonhemorrhagic R cerebellar infarct. Several other remote areas of infarction.  2/10 echo> LVEF 30-35%, severe hypokinesis of anteroseptal and apical wall, with some degree of diastolic dysfunction.   SUBJECTIVE:  Tolerating pressure support.  VITAL SIGNS: Reviewed  PHYSICAL EXAMINATION: General: no distress  Neuro:  Agitated at times. No focal def.  HEENT:  /AT, no JVD noted. Orally intubated  Cardiovascular:  RRR no MRG Lungs:  Coarse bilateral breath sounds.  Abdomen:  Soft, non-tender, non-distended Musculoskeletal:  No acute deformity, moves all extremities.  Skin:  Grossly intact   Recent Labs Lab 11/27/14 1026 11/28/14 0710 11/30/14 0745  NA 136 139 138  K 2.9* 3.9 2.8*  CL 103 107 100  CO2 27 19 29   BUN <5* <5* 7  CREATININE 0.73 0.69 0.83  GLUCOSE 90 82 124*    Recent Labs Lab 11/25/14 1451 11/27/14 1026 11/30/14 0745  HGB 9.7* 9.2* 8.6*  HCT 27.3* 26.6* 25.1*  WBC 8.2 7.9 9.2  PLT 197 243 314   Dg Chest Port 1 View  11/30/2014   CLINICAL DATA:  Respiratory failure  EXAM:  PORTABLE CHEST - 1 VIEW  COMPARISON:  11/29/2014  FINDINGS: Endotracheal tube tip is 1 cm above the carina. Left jugular central line extends into the SVC at the azygos vein junction. Confluent airspace consolidation in the left base, with improvement from the previous day. There has been partial clearance of the perihilar and lateral base opacity. There may be of very small left pleural effusion.  IMPRESSION: Improved, with partial clearance of airspace opacities in the central and basilar left lung.   Electronically Signed   By: Andreas Newport M.D.   On: 11/30/2014 06:40   Dg Chest Port 1 View  11/29/2014   CLINICAL DATA:  Endotracheal intubation.  EXAM: PORTABLE CHEST - 1 VIEW  COMPARISON:  11/28/2014  FINDINGS: Sternotomy wires and prosthetic heart valve unchanged. Endotracheal tube has tip 3.3 cm above the carina. Left IJ central venous catheter is unchanged with tip overlying the region of the SVC at the level of the azygos vein.  Lungs are adequately inflated and demonstrate persistent perihilar opacification with mild interval improvement on the right likely interstitial edema. Persistent left basilar opacification with slight interval worsening likely left-sided effusion with associated atelectasis, although cannot exclude infection. Cardiomediastinal silhouette and remainder of the exam is unchanged.  IMPRESSION: Persistent bilateral perihilar opacification in its mild right-sided improvement likely interstitial edema. Slight worsening left basilar opacification likely effusion with atelectasis although cannot exclude infection.  Tubes and lines as described.   Electronically Signed   By: Marin Olp M.D.   On: 11/29/2014 13:47   Dg Chest  Port 1 View  11/28/2014   CLINICAL DATA:  Respiratory failure  EXAM: PORTABLE CHEST - 1 VIEW  COMPARISON:  11/24/2014  FINDINGS: Endotracheal tube in good position. Central venous catheter tip in the region of the azygos vein unchanged. No pneumothorax.   Diffuse bilateral airspace disease shows mild progression consistent with pulmonary edema. Small pleural effusions and mild bibasilar atelectasis.  IMPRESSION: Progression of bilateral edema.  Small bilateral pleural effusions and mild bibasilar atelectasis left greater than right.   Electronically Signed   By: Franchot Gallo M.D.   On: 11/28/2014 12:49    ASSESSMENT / PLAN:  Ventilatory Dependent Respiratory Failure r/t aspiration PNA, Pulmonary edema and acute encephalopathy. Acute systolic/diastolic CHF. Upper airway edema  Plan - full vent support for now - plan for tracheostomy - continue solumedrol - add protonix to control reflux better - Continue ceftriaxone per primary team - continue budesonide, duoneb - Keep volume negative. Depending on CXR results, BNP may want to consider diuresis. - Will need repeat echo at some point   ETOH abuse Seizures Acute CVA - Management per primary team  CC time 35 minutes.  Discussed with [facility] staff during multidisciplinary rounds.  Chesley Mires, MD [facility] Pulmonary/Critical Care 11/30/2014, 11:27 AM Pager:  [phone] After 3pm call: [phone]

## 2014-12-01 ENCOUNTER — Encounter (HOSPITAL_COMMUNITY): Payer: Self-pay

## 2014-12-01 ENCOUNTER — Other Ambulatory Visit (HOSPITAL_COMMUNITY): Payer: Self-pay

## 2014-12-01 LAB — CULTURE, BLOOD (ROUTINE X 2)
CULTURE: NO GROWTH
CULTURE: NO GROWTH

## 2014-12-01 LAB — PROTIME-INR
INR: 1.18 (ref 0.00–1.49)
Prothrombin Time: 15.1 seconds (ref 11.6–15.2)

## 2014-12-01 LAB — POTASSIUM
POTASSIUM: 3.5 mmol/L (ref 3.5–5.1)
Potassium: 2.9 mmol/L — ABNORMAL LOW (ref 3.5–5.1)

## 2014-12-01 LAB — MAGNESIUM: Magnesium: 1.3 mg/dL — ABNORMAL LOW (ref 1.5–2.5)

## 2014-12-01 NOTE — Procedures (Signed)
Bedside Tracheostomy Insertion Procedure Note   Patient Details:   Name: [name] DOB: 1956-08-12 MRN: [id number]  Procedure: Tracheostomy  Pre Procedure Assessment: ET Tube Size:7.0 ET Tube secured at lip (cm):24 Bite block in place: No Breath Sounds: Rhonch  Post Procedure Assessment: There were no vitals taken for this visit. O2 sats: stable throughout Complications: No apparent complications Patient did tolerate procedure well Tracheostomy Brand:Shiley Tracheostomy Style:Cuffed Tracheostomy Size: 6 Tracheostomy Secured YGE:[id number] Tracheostomy Placement Confirmation:Trach cuff visualized and in place and Chest X ray ordered for placement    Ciro Backer 12/01/2014, 9:38 AM

## 2014-12-01 NOTE — Procedures (Signed)
Bronchoscopy Procedure Note [name] [id number] 26-Mar-1956  Procedure: Bronchoscopic assistance for percutaneous tracheostomy Indications: Diagnostic evaluation of the airways  Procedure Details Consent: Risks of procedure as well as the alternatives and risks of each were explained to the (patient/caregiver).  Consent for procedure obtained. Time Out: Verified patient identification, verified procedure, site/side was marked, verified correct patient position, special equipment/implants available, medications/allergies/relevent history reviewed, required imaging and test results available.  Performed  She was placed on 100% FiO2.  Please see note from Dr. Nelda Marseille for details about tracheostomy.  Bronchoscope was entered through ETT which was then retracted from 24 cm to 17 cm at the lip.  Angiocath needle seen entering trachea and guidewire visualized w/o evidence for posterior membrane injury.  Then visualized dilators and tracheostomy being inserted w/o posterior membrane injury.  Bronchoscope removed from ETT and placed in trach with trach visualized in good position above the carina w/o evidence for bleeding or airway trauma.  She had #6 cuffed trach placed.  Bronchoscope removed, and she was placed on ventilator.  No immediate complications.   Post-procedure CXR pending.  Chesley Mires, MD [facility] Pulmonary/Critical Care 12/01/2014, 9:48 AM Pager:  [phone] After 3pm call: [phone]

## 2014-12-01 NOTE — Procedures (Signed)
Percutaneous Tracheostomy Placement  Consent from family.  Patient sedated, paralyzed and position.  Placed on 100% FiO2 and RR matched.  Area cleaned and draped.  Lidocaine/epi injected.  Skin incision done followed by blunt dissection.  Trachea palpated then punctured, catheter passed and visualized bronchoscopically.  Wire placed and visualized.  Catheter removed.  Airway then crushed and dilated.  Size 6 cuffed shiley trach placed and visualized bronchoscopically well above carina.  Good volume returns.  Patient tolerated the procedure well without complications.  Minimal blood loss.  CXR ordered and pending.  Wesam G. Yacoub, M.D. [place] Pulmonary/Critical Care Medicine. Pager: [phone]. After hours pager: [phone]. 

## 2014-12-02 LAB — BLOOD GAS, ARTERIAL
ACID-BASE EXCESS: 2.5 mmol/L — AB (ref 0.0–2.0)
Bicarbonate: 25.6 mEq/L — ABNORMAL HIGH (ref 20.0–24.0)
FIO2: 0.4 %
O2 Saturation: 98.4 %
PATIENT TEMPERATURE: 98.6
PEEP/CPAP: 5 cmH2O
RATE: 14 resp/min
TCO2: 26.6 mmol/L (ref 0–100)
pCO2 arterial: 33.4 mmHg — ABNORMAL LOW (ref 35.0–45.0)
pH, Arterial: 7.496 — ABNORMAL HIGH (ref 7.350–7.450)
pO2, Arterial: 131 mmHg — ABNORMAL HIGH (ref 80.0–100.0)

## 2014-12-02 LAB — TSH: TSH: 0.806 u[IU]/mL (ref 0.350–4.500)

## 2014-12-02 LAB — CBC
HCT: 23.7 % — ABNORMAL LOW (ref 36.0–46.0)
HEMOGLOBIN: 8.1 g/dL — AB (ref 12.0–15.0)
MCH: 29.5 pg (ref 26.0–34.0)
MCHC: 34.2 g/dL (ref 30.0–36.0)
MCV: 86.2 fL (ref 78.0–100.0)
Platelets: 346 10*3/uL (ref 150–400)
RBC: 2.75 MIL/uL — ABNORMAL LOW (ref 3.87–5.11)
RDW: 16.6 % — ABNORMAL HIGH (ref 11.5–15.5)
WBC: 8.9 10*3/uL (ref 4.0–10.5)

## 2014-12-02 LAB — BASIC METABOLIC PANEL
Anion gap: 11 (ref 5–15)
BUN: 18 mg/dL (ref 6–23)
CO2: 26 mmol/L (ref 19–32)
Calcium: 8.2 mg/dL — ABNORMAL LOW (ref 8.4–10.5)
Chloride: 102 mmol/L (ref 96–112)
Creatinine, Ser: 1.15 mg/dL — ABNORMAL HIGH (ref 0.50–1.10)
GFR calc Af Amer: 60 mL/min — ABNORMAL LOW (ref >90)
GFR, EST NON AFRICAN AMERICAN: 52 mL/min — AB (ref >90)
Glucose, Bld: 119 mg/dL — ABNORMAL HIGH (ref 70–99)
Potassium: 3.5 mmol/L (ref 3.5–5.1)
Sodium: 139 mmol/L (ref 135–145)

## 2014-12-02 NOTE — Progress Notes (Signed)
IR aware of request for perc g-tube. Recent plain film imaging reviewed, pt appears to have colonic distension/ileus, though last abd imaging was 2/14. Per Dr. Annamaria Boots, will get CT abdomen without contrast to further assess abdominal anatomy for potential candidacy for Perc G-tube placement.  Ascencion Dike PA-C Interventional Radiology 12/02/2014 12:54 PM

## 2014-12-04 ENCOUNTER — Other Ambulatory Visit (HOSPITAL_COMMUNITY): Payer: Self-pay

## 2014-12-04 DIAGNOSIS — I495 Sick sinus syndrome: Secondary | ICD-10-CM

## 2014-12-04 LAB — BASIC METABOLIC PANEL
Anion gap: 7 (ref 5–15)
BUN: 17 mg/dL (ref 6–23)
CHLORIDE: 99 mmol/L (ref 96–112)
CO2: 29 mmol/L (ref 19–32)
Calcium: 8.5 mg/dL (ref 8.4–10.5)
Creatinine, Ser: 1.01 mg/dL (ref 0.50–1.10)
GFR calc Af Amer: 71 mL/min — ABNORMAL LOW (ref >90)
GFR, EST NON AFRICAN AMERICAN: 61 mL/min — AB (ref >90)
Glucose, Bld: 130 mg/dL — ABNORMAL HIGH (ref 70–99)
Potassium: 2.6 mmol/L — CL (ref 3.5–5.1)
Sodium: 135 mmol/L (ref 135–145)

## 2014-12-04 LAB — COMPREHENSIVE METABOLIC PANEL
ALBUMIN: 3 g/dL — AB (ref 3.5–5.2)
ALK PHOS: 49 U/L (ref 39–117)
ALT: 21 U/L (ref 0–35)
AST: 25 U/L (ref 0–37)
Anion gap: 11 (ref 5–15)
BUN: 15 mg/dL (ref 6–23)
CHLORIDE: 97 mmol/L (ref 96–112)
CO2: 30 mmol/L (ref 19–32)
CREATININE: 0.91 mg/dL (ref 0.50–1.10)
Calcium: 8.8 mg/dL (ref 8.4–10.5)
GFR calc Af Amer: 80 mL/min — ABNORMAL LOW (ref >90)
GFR calc non Af Amer: 69 mL/min — ABNORMAL LOW (ref >90)
Glucose, Bld: 131 mg/dL — ABNORMAL HIGH (ref 70–99)
Potassium: 4.1 mmol/L (ref 3.5–5.1)
Sodium: 138 mmol/L (ref 135–145)
TOTAL PROTEIN: 7.2 g/dL (ref 6.0–8.3)
Total Bilirubin: 0.5 mg/dL (ref 0.3–1.2)

## 2014-12-04 LAB — CBC
HEMATOCRIT: 24.6 % — AB (ref 36.0–46.0)
Hemoglobin: 8.4 g/dL — ABNORMAL LOW (ref 12.0–15.0)
MCH: 29 pg (ref 26.0–34.0)
MCHC: 34.1 g/dL (ref 30.0–36.0)
MCV: 84.8 fL (ref 78.0–100.0)
Platelets: 396 10*3/uL (ref 150–400)
RBC: 2.9 MIL/uL — AB (ref 3.87–5.11)
RDW: 16.3 % — ABNORMAL HIGH (ref 11.5–15.5)
WBC: 8.9 10*3/uL (ref 4.0–10.5)

## 2014-12-04 LAB — MAGNESIUM: Magnesium: 1.7 mg/dL (ref 1.5–2.5)

## 2014-12-04 LAB — PHOSPHORUS: PHOSPHORUS: 2.7 mg/dL (ref 2.3–4.6)

## 2014-12-04 LAB — PROTIME-INR
INR: 1.16 (ref 0.00–1.49)
Prothrombin Time: 14.9 seconds (ref 11.6–15.2)

## 2014-12-04 LAB — TRIGLYCERIDES: TRIGLYCERIDES: 140 mg/dL (ref <150)

## 2014-12-04 LAB — POTASSIUM: POTASSIUM: 3.1 mmol/L — AB (ref 3.5–5.1)

## 2014-12-04 NOTE — Progress Notes (Signed)
Name: [name] MRN: [id number] DOB: 01-07-1956    ADMISSION DATE:  11/24/2014 CONSULTATION DATE:  11/28/2014  REFERRING MD :  Dr. Shawnie Pons  CHIEF COMPLAINT: VDRF  BRIEF PATIENT DESCRIPTION:  59 year old female from [facility]. Admitted 11/20/14 for seizure activity and respritary failure. Found to have CVA and aspiration PNA. Required mechanical ventilation. She had continued encephalopathy due to CVA and was unable to be weaned from ventilator. Transferred to [facility] 2/12. PCCM consult 2/16.   SIGNIFICANT EVENTS  2/8 admitted to [facility] with seizures, CVA, respiratory failure 2nd to aspiration PNA. 2/10 Intubated for respiratory insufficiency 2/12 Transferred to [facility] 2/16 PCCM consult.  2/17 passed sbt extubated >> re-intubated; upper airway edema noted  STUDIES:  2/9 MRI brain > evolution of right parietal infarct, now with encephalomalacia and laminar necrosis. New small acute nonhemorrhagic R cerebellar infarct. Several other remote areas of infarction.  2/10 echo> LVEF 30-35%, severe hypokinesis of anteroseptal and apical wall, with some degree of diastolic dysfunction.   SUBJECTIVE:  Tolerating pressure support.  VITAL SIGNS: Reviewed  PHYSICAL EXAMINATION: General: no distress  Neuro:  Agitated at times. No focal def.  HEENT:  [place]/AT, no JVD noted. Trach in place  Cardiovascular:  RRR no MRG Lungs: decreased in bases  Abdomen:  Soft, non-tender, non-distended Musculoskeletal:  No acute deformity, moves all extremities.  Skin:  Grossly intact   Recent Labs Lab 11/30/14 0745  12/01/14 1909 12/02/14 0823 12/04/14 0738  NA 138  --   --  139 135  K 2.8*  < > 3.5 3.5 2.6*  CL 100  --   --  102 99  CO2 29  --   --  26 29  BUN 7  --   --  18 17  CREATININE 0.83  --   --  1.15* 1.01  GLUCOSE 124*  --   --  119* 130*  < > = values in this interval not displayed.  Recent Labs Lab 11/30/14 0745 12/02/14 0823 12/04/14 0738  HGB 8.6* 8.1* 8.4*  HCT 25.1* 23.7* 24.6*  WBC 9.2  8.9 8.9  PLT 314 346 396   Dg Chest Port 1 View  12/04/2014   CLINICAL DATA:  Respiratory failure, history of CHF, tracheostomy patient.  EXAM: PORTABLE CHEST - 1 VIEW  COMPARISON:  Portable chest x-ray of December 01, 2014  FINDINGS: The lungs are adequately inflated. The interstitial markings have improved bilaterally but there is persistent interstitial density in the left mid and lower lung with partial obscuration of the left hemidiaphragm. The cardiac silhouette is normal in size. The prosthetic cardiac valve ring is again demonstrated. There are 8 intact sternal wires. The endotracheal tube tip projects 3.4 cm above the crotch of the carina. The right internal jugular venous catheter lies at level of the junction of the right and left brachiocephalic veins. The bony thorax is unremarkable.  IMPRESSION: Overall improvement in the appearance of the pulmonary interstitium. There remains left lower lobe atelectasis and/or interstitial pneumonia.   Electronically Signed   By: David  Martinique   On: 12/04/2014 07:50    ASSESSMENT / PLAN:  Ventilatory Dependent Respiratory Failure r/t aspiration PNA, Pulmonary edema and acute encephalopathy. Klebsiella pneumonia Acute systolic/diastolic CHF. Upper airway edema Tracheostomy status   Discussion She looks good. Should be able to come off vent. Think airway and agitation were the bigger issues for her earlier. Should do well w/ ATC.   Plan - wean ssh protocol-->will try ATC as able.  -  continue solumedrol, wean as able - add protonix to control reflux better - Continue ceftriaxone per primary team - continue budesonide, duoneb - Will need repeat echo at some point   ETOH abuse Seizures Acute CVA - Management per primary team   Erick Colace ACNP-BC [place] Pager # [phone] OR # [phone] if no answer  Attending:  I have seen and examined the patient with nurse practitioner/resident and agree with the note  above.   She looks great on ATC Lungs clear Continue to wean as able  Roselie Awkward, MD [place] PCCM Pager: [phone] Cell: [phone] If no response, call [phone]

## 2014-12-04 NOTE — Progress Notes (Signed)
  Echocardiogram 2D Echocardiogram has been performed.  Diamond Nickel 12/04/2014, 9:21 AM

## 2014-12-05 ENCOUNTER — Other Ambulatory Visit (HOSPITAL_COMMUNITY): Payer: Self-pay

## 2014-12-05 LAB — POTASSIUM: Potassium: 3.5 mmol/L (ref 3.5–5.1)

## 2014-12-06 ENCOUNTER — Other Ambulatory Visit (HOSPITAL_COMMUNITY): Payer: Self-pay

## 2014-12-06 DIAGNOSIS — R1310 Dysphagia, unspecified: Secondary | ICD-10-CM | POA: Insufficient documentation

## 2014-12-06 NOTE — Consult Note (Signed)
Chief Complaint: CVA Seizure disorder aspiration  Referring Physician(s): [facility]  History of Present Illness: [name] is a 59 y.o. female   Pt with hx etoh abuse CVA sz disorder encephalopathy Hypoxia/resp failure Ad[facility] 11/24/2014 +trach Aspiration  Dysphagia/malnutrition Request for percutaneous gastric tube placement Imaging reviewed by Dr Annamaria Boots Scheduled for G tube in IR 2/25 I have seen and examined pt  Past Medical History  Diagnosis Date  . ETOH abuse   . CVA (cerebral infarction)   . Acute CHF (congestive heart failure)   . Stress-induced cardiomyopathy     No past surgical history on file.  Allergies: Review of patient's allergies indicates not on file.  Medications: Prior to Admission medications   Not on File     No family history on file.  History   Social History  . Marital Status: Married    Spouse Name: N/A  . Number of Children: N/A  . Years of Education: N/A   Social History Main Topics  . Smoking status: Not on file  . Smokeless tobacco: Not on file  . Alcohol Use: Not on file  . Drug Use: Not on file  . Sexual Activity: Not on file   Other Topics Concern  . None   Social History Narrative     Review of Systems: A 12 point ROS discussed and pertinent positives are indicated in the HPI above.  All other systems are negative.  Review of Systems  Constitutional: Positive for activity change. Negative for fever.  Psychiatric/Behavioral: Positive for agitation.     Vital Signs: There were no vitals taken for this visit.  Physical Exam  Cardiovascular: Normal rate and regular rhythm.   Pulmonary/Chest: Effort normal. She has wheezes.  Abdominal: Soft. Bowel sounds are normal.  Musculoskeletal:  Follows few commands No response Up in chair  Neurological:  Follow few commands No response  Skin: Skin is warm and dry.  Psychiatric:  consented sister via phone  Nursing note and vitals  reviewed.   Mallampati Score:  MD Evaluation Airway: Other (comments) Airway comments: trach Heart: WNL Abdomen: WNL Chest/ Lungs: WNL ASA  Classification: 3 Mallampati/Airway Score: Two  Imaging: Ct Abdomen Wo Contrast  12/06/2014   CLINICAL DATA:  59 year old female with dysphagia. Evaluate anatomy prior to possible percutaneous gastrostomy tube placement.  EXAM: CT ABDOMEN WITHOUT CONTRAST  TECHNIQUE: Multidetector CT imaging of the abdomen was performed following the standard protocol without IV contrast.  COMPARISON:  Prior abdominal radiograph 11/26/2014  FINDINGS: Lower Chest: Mild dependent atelectasis in the lower lobes. In the posterior aspect of the right lower lobe there is a branching tubular structure with a slightly thickened margin which is favored to represent focal bronchiectasis rather than a cavitary lesion. The structure measures 12 mm in greatest dimension on image 7 of series 205. Replaced mitral valve. The heart is within normal limits for size. No pericardial effusion. Unremarkable visualized distal thoracic esophagus.  Abdomen: Unenhanced CT was performed per clinician order. Lack of IV contrast limits sensitivity and specificity, especially for evaluation of abdominal/pelvic solid viscera. Within these limitations, unremarkable CT appearance of the stomach and duodenum. No anatomic abnormality to preclude placement of a percutaneous gastrostomy tube. Also unremarkable are the appearances of the spleen, adrenal glands and pancreas. Normal hepatic contour and morphology. No discrete hepatic lesion. High attenuation material layering within the gallbladder lumen consistent with small stones. No significant gallbladder wall thickening or pericholecystic fluid. Cluster of punctate calcifications in the porta  hepatis on image 22 of series 201 may represent punctate calcifications within a small lymph node, or possibly stones within the cystic duct. No evidence of stone within the  common bile duct.  Unremarkable appearance of the bilateral kidneys. No focal solid lesion, hydronephrosis or nephrolithiasis. No evidence of bowel wall thickening or bowel obstruction. No free fluid or suspicious adenopathy.  Bones/Soft Tissues: No acute fracture or aggressive appearing lytic or blastic osseous lesion.  Vascular: Limited evaluation in the absence of intravenous contrast material. Extensive atherosclerotic vascular calcifications. No aneurysmal dilatation in the visualized segments.  IMPRESSION: 1. Conventional gastric anatomy. No anatomic or post surgical abnormalities to preclude percutaneous gastrostomy tube placement. 2. No acute abnormality in the abdomen. 3. Cholelithiasis without evidence of active cholecystitis. 4. 12 mm tubular structure with a suggestion of branching in the right lower lobe is favored to reflect focal cylindrical bronchiectasis. However, without prior imaging for comparison a small cavitary lesion is difficult to exclude entirely. Recommend followup with repeat chest CT in 3 months to confirm stability.   Electronically Signed   By: Jacqulynn Cadet M.D.   On: 12/06/2014 07:25   Dg Chest Port 1 View  12/04/2014   CLINICAL DATA:  Respiratory failure, history of CHF, tracheostomy patient.  EXAM: PORTABLE CHEST - 1 VIEW  COMPARISON:  Portable chest x-ray of December 01, 2014  FINDINGS: The lungs are adequately inflated. The interstitial markings have improved bilaterally but there is persistent interstitial density in the left mid and lower lung with partial obscuration of the left hemidiaphragm. The cardiac silhouette is normal in size. The prosthetic cardiac valve ring is again demonstrated. There are 8 intact sternal wires. The endotracheal tube tip projects 3.4 cm above the crotch of the carina. The right internal jugular venous catheter lies at level of the junction of the right and left brachiocephalic veins. The bony thorax is unremarkable.  IMPRESSION: Overall  improvement in the appearance of the pulmonary interstitium. There remains left lower lobe atelectasis and/or interstitial pneumonia.   Electronically Signed   By: David  Martinique   On: 12/04/2014 07:50   Dg Chest Port 1 View  12/01/2014   CLINICAL DATA:  Tracheostomy placement  EXAM: PORTABLE CHEST - 1 VIEW  COMPARISON:  November 30, 2014  FINDINGS: Tracheostomy tube tip is 4.3 cm above the carina. Central catheter tip is in the superior vena cava just beyond the junction with the left innominate vein. No pneumothorax.  There is consolidation in the left base medially. There is slight generalized interstitial edema. Heart size is within normal limits. Pulmonary vascularity is normal. There is a prosthetic cardiac valve, stable  IMPRESSION: Tube and catheter positions as described without pneumothorax. Mild generalized interstitial edema. Consolidation left lower lobe.   Electronically Signed   By: Lowella Grip III M.D.   On: 12/01/2014 09:59   Dg Chest Port 1 View  11/30/2014   CLINICAL DATA:  Respiratory failure  EXAM: PORTABLE CHEST - 1 VIEW  COMPARISON:  11/29/2014  FINDINGS: Endotracheal tube tip is 1 cm above the carina. Left jugular central line extends into the SVC at the azygos vein junction. Confluent airspace consolidation in the left base, with improvement from the previous day. There has been partial clearance of the perihilar and lateral base opacity. There may be of very small left pleural effusion.  IMPRESSION: Improved, with partial clearance of airspace opacities in the central and basilar left lung.   Electronically Signed   By: Valerie Roys.D.  On: 11/30/2014 06:40   Dg Chest Port 1 View  11/29/2014   CLINICAL DATA:  Endotracheal intubation.  EXAM: PORTABLE CHEST - 1 VIEW  COMPARISON:  11/28/2014  FINDINGS: Sternotomy wires and prosthetic heart valve unchanged. Endotracheal tube has tip 3.3 cm above the carina. Left IJ central venous catheter is unchanged with tip overlying  the region of the SVC at the level of the azygos vein.  Lungs are adequately inflated and demonstrate persistent perihilar opacification with mild interval improvement on the right likely interstitial edema. Persistent left basilar opacification with slight interval worsening likely left-sided effusion with associated atelectasis, although cannot exclude infection. Cardiomediastinal silhouette and remainder of the exam is unchanged.  IMPRESSION: Persistent bilateral perihilar opacification in its mild right-sided improvement likely interstitial edema. Slight worsening left basilar opacification likely effusion with atelectasis although cannot exclude infection.  Tubes and lines as described.   Electronically Signed   By: Marin Olp M.D.   On: 11/29/2014 13:47   Dg Chest Port 1 View  11/28/2014   CLINICAL DATA:  Respiratory failure  EXAM: PORTABLE CHEST - 1 VIEW  COMPARISON:  11/24/2014  FINDINGS: Endotracheal tube in good position. Central venous catheter tip in the region of the azygos vein unchanged. No pneumothorax.  Diffuse bilateral airspace disease shows mild progression consistent with pulmonary edema. Small pleural effusions and mild bibasilar atelectasis.  IMPRESSION: Progression of bilateral edema.  Small bilateral pleural effusions and mild bibasilar atelectasis left greater than right.   Electronically Signed   By: Franchot Gallo M.D.   On: 11/28/2014 12:49   Dg Chest Port 1 View  11/24/2014   CLINICAL DATA:  New admission. Transfer from [facility]. Confirm endotracheal tube.  EXAM: PORTABLE CHEST - 1 VIEW  COMPARISON:  11/23/2014  FINDINGS: Endotracheal tube is in place with tip approximately 2.0 cm above carina. Nasogastric tube is in place with tip overlying the level of the stomach. Left IJ central line tip overlies the level of the superior vena cava.  Patient has had median sternotomy and valve replacement. The heart size is normal. There is opacity at the medial left lung base  obscuring the hemidiaphragm. Patchy infiltrates persists in lungs bilaterally.  There is gaseous distention of colonic loops. No evidence for free air.  IMPRESSION: 1. Lines and tubes as described. 2. Persistent airspace filling opacities, confluent at the left lung base.   Electronically Signed   By: Nolon Nations M.D.   On: 11/24/2014 21:07   Dg Abd Portable 1v  11/26/2014   CLINICAL DATA:  Bowel trouble.  Dilated bowel loops.  EXAM: PORTABLE ABDOMEN - 1 VIEW  COMPARISON:  11/24/2014  FINDINGS: There is gaseous distention of nondilated loops of colon. Coarse calcification in the right hemipelvis likely represents a calcified fibroid. Vascular calcifications are noted. Patient has had previous ORIF of left hip.  IMPRESSION: Nonobstructive bowel gas pattern.   Electronically Signed   By: Nolon Nations M.D.   On: 11/26/2014 08:40   Dg Abd Portable 1v  11/24/2014   CLINICAL DATA:  New admission. Transfer from [place] tube placement.  EXAM: PORTABLE ABDOMEN - 1 VIEW  COMPARISON:  11/22/2014  FINDINGS: Orogastric tube tip is in place with tip overlying the level of the distal stomach. There is gaseous distention of colonic loops. Patient has had ORIF left hip.  IMPRESSION: Orogastric tube tip overlies the level of the distal stomach.   Electronically Signed   By: Nolon Nations M.D.   On:  11/24/2014 21:08    Labs:  CBC:  Recent Labs  11/27/14 1026 11/30/14 0745 12/02/14 0823 12/04/14 0738  WBC 7.9 9.2 8.9 8.9  HGB 9.2* 8.6* 8.1* 8.4*  HCT 26.6* 25.1* 23.7* 24.6*  PLT 243 314 346 396    COAGS:  Recent Labs  12/01/14 0810 12/04/14 0738  INR 1.18 1.16    BMP:  Recent Labs  11/30/14 0745  12/02/14 0823 12/04/14 0738 12/04/14 1612 12/04/14 2024 12/05/14 0746  NA 138  --  139 135 138  --   --   K 2.8*  < > 3.5 2.6* 4.1 3.1* 3.5  CL 100  --  102 99 97  --   --   CO2 29  --  26 29 30   --   --   GLUCOSE 124*  --  119* 130* 131*  --   --   BUN 7  --   18 17 15   --   --   CALCIUM 8.3*  --  8.2* 8.5 8.8  --   --   CREATININE 0.83  --  1.15* 1.01 0.91  --   --   GFRNONAA 77*  --  52* 61* 69*  --   --   GFRAA 90*  --  60* 71* 80*  --   --   < > = values in this interval not displayed.  LIVER FUNCTION TESTS:  Recent Labs  11/25/14 0630 12/04/14 1612  BILITOT 0.7 0.5  AST 26 25  ALT 10 21  ALKPHOS 49 49  PROT 5.3* 7.2  ALBUMIN 2.0* 3.0*    TUMOR MARKERS: No results for input(s): AFPTM, CEA, CA199, CHROMGRNA in the last 8760 hours.  Assessment and Plan:  sz disorder Aspiration  Dysphagia/malnutrition Need for long term care Scheduled for percutaneous gastric tube placement pts family aware of procedure benefits and risks including but not limited to Infection; bleeding; organ damage; damage to surrounding structures Agreeable to proceed Consent signed andin chart Ancef on call  Thank you for this interesting consult.  I greatly enjoyed meeting [facility] and look forward to participating in their care.  Signed: Trenice Mesa A 12/06/2014, 1:11 PM   I spent a total of 40 Minutes  in face to face in clinical consultation, greater than 50% of which was counseling/coordinating care for perc gastric tube placement

## 2014-12-07 ENCOUNTER — Other Ambulatory Visit (HOSPITAL_COMMUNITY): Payer: Self-pay

## 2014-12-07 DIAGNOSIS — I639 Cerebral infarction, unspecified: Secondary | ICD-10-CM | POA: Insufficient documentation

## 2014-12-07 LAB — BASIC METABOLIC PANEL
Anion gap: 9 (ref 5–15)
BUN: 27 mg/dL — AB (ref 6–23)
CALCIUM: 8.9 mg/dL (ref 8.4–10.5)
CO2: 32 mmol/L (ref 19–32)
CREATININE: 0.86 mg/dL (ref 0.50–1.10)
Chloride: 100 mmol/L (ref 96–112)
GFR calc Af Amer: 86 mL/min — ABNORMAL LOW (ref >90)
GFR, EST NON AFRICAN AMERICAN: 74 mL/min — AB (ref >90)
GLUCOSE: 190 mg/dL — AB (ref 70–99)
POTASSIUM: 2.9 mmol/L — AB (ref 3.5–5.1)
Sodium: 141 mmol/L (ref 135–145)

## 2014-12-07 LAB — CBC
HCT: 28.9 % — ABNORMAL LOW (ref 36.0–46.0)
Hemoglobin: 9.6 g/dL — ABNORMAL LOW (ref 12.0–15.0)
MCH: 29.5 pg (ref 26.0–34.0)
MCHC: 33.2 g/dL (ref 30.0–36.0)
MCV: 88.9 fL (ref 78.0–100.0)
Platelets: 331 10*3/uL (ref 150–400)
RBC: 3.25 MIL/uL — ABNORMAL LOW (ref 3.87–5.11)
RDW: 17 % — ABNORMAL HIGH (ref 11.5–15.5)
WBC: 9.4 10*3/uL (ref 4.0–10.5)

## 2014-12-07 MED ORDER — IOHEXOL 300 MG/ML  SOLN
50.0000 mL | Freq: Once | INTRAMUSCULAR | Status: AC | PRN
  Administered 2014-12-07: 15 mL

## 2014-12-07 MED ORDER — FENTANYL CITRATE 0.05 MG/ML IJ SOLN
INTRAMUSCULAR | Status: AC
  Filled 2014-12-07: qty 2

## 2014-12-07 MED ORDER — LIDOCAINE HCL 1 % IJ SOLN
INTRAMUSCULAR | Status: AC
  Filled 2014-12-07: qty 20

## 2014-12-07 MED ORDER — GLUCAGON HCL RDNA (DIAGNOSTIC) 1 MG IJ SOLR
INTRAMUSCULAR | Status: AC | PRN
  Administered 2014-12-07: 1 mg via INTRAVENOUS

## 2014-12-07 MED ORDER — FENTANYL CITRATE 0.05 MG/ML IJ SOLN
INTRAMUSCULAR | Status: AC | PRN
  Administered 2014-12-07: 25 ug via INTRAVENOUS

## 2014-12-07 MED ORDER — MIDAZOLAM HCL 2 MG/2ML IJ SOLN
INTRAMUSCULAR | Status: AC
  Filled 2014-12-07: qty 2

## 2014-12-07 MED ORDER — GLUCAGON HCL RDNA (DIAGNOSTIC) 1 MG IJ SOLR
INTRAMUSCULAR | Status: AC
  Filled 2014-12-07: qty 1

## 2014-12-07 MED ORDER — MIDAZOLAM HCL 2 MG/2ML IJ SOLN
INTRAMUSCULAR | Status: AC | PRN
  Administered 2014-12-07: 0.5 mg via INTRAVENOUS

## 2014-12-07 NOTE — Progress Notes (Signed)
[facility] PCCM  Patient getting peg Bedside chart reviewed, she is tolerating ATC  If doing well could cap trach but would watch for stridor carefully prior to decannulation.  Will decrease solumedrol, wean off over next 4-5 days  PCCM to sign off, call if questions  Roselie Awkward, MD [place] [facility] Pager: [phone] Cell: [phone] If no response, call [phone]

## 2014-12-07 NOTE — Procedures (Signed)
20 Fr pull through G tube No comp

## 2014-12-08 LAB — POTASSIUM: POTASSIUM: 3.9 mmol/L (ref 3.5–5.1)

## 2014-12-11 LAB — BASIC METABOLIC PANEL
Anion gap: 5 (ref 5–15)
BUN: 20 mg/dL (ref 6–23)
CALCIUM: 8.6 mg/dL (ref 8.4–10.5)
CO2: 34 mmol/L — ABNORMAL HIGH (ref 19–32)
Chloride: 102 mmol/L (ref 96–112)
Creatinine, Ser: 0.73 mg/dL (ref 0.50–1.10)
GFR calc Af Amer: >90 mL/min (ref >90)
GLUCOSE: 129 mg/dL — AB (ref 70–99)
Potassium: 4 mmol/L (ref 3.5–5.1)
Sodium: 141 mmol/L (ref 135–145)

## 2014-12-11 LAB — MAGNESIUM: Magnesium: 1.6 mg/dL (ref 1.5–2.5)

## 2014-12-12 ENCOUNTER — Ambulatory Visit
Admit: 2014-12-12 | Disposition: A | Discharge status: Home / Self Care | Payer: Self-pay | Attending: Internal Medicine | Admitting: Internal Medicine

## 2014-12-14 ENCOUNTER — Other Ambulatory Visit (HOSPITAL_COMMUNITY): Payer: Self-pay

## 2014-12-14 LAB — CBC
HEMATOCRIT: 27.7 % — AB (ref 36.0–46.0)
Hemoglobin: 9.2 g/dL — ABNORMAL LOW (ref 12.0–15.0)
MCH: 29 pg (ref 26.0–34.0)
MCHC: 33.2 g/dL (ref 30.0–36.0)
MCV: 87.4 fL (ref 78.0–100.0)
PLATELETS: 244 10*3/uL (ref 150–400)
RBC: 3.17 MIL/uL — ABNORMAL LOW (ref 3.87–5.11)
RDW: 17.3 % — AB (ref 11.5–15.5)
WBC: 9 10*3/uL (ref 4.0–10.5)

## 2014-12-14 LAB — BASIC METABOLIC PANEL
Anion gap: 12 (ref 5–15)
BUN: 24 mg/dL — ABNORMAL HIGH (ref 6–23)
CO2: 29 mmol/L (ref 19–32)
Calcium: 8.9 mg/dL (ref 8.4–10.5)
Chloride: 100 mmol/L (ref 96–112)
Creatinine, Ser: 0.86 mg/dL (ref 0.50–1.10)
GFR calc Af Amer: 86 mL/min — ABNORMAL LOW (ref >90)
GFR calc non Af Amer: 74 mL/min — ABNORMAL LOW (ref >90)
Glucose, Bld: 87 mg/dL (ref 70–99)
Potassium: 3.7 mmol/L (ref 3.5–5.1)
SODIUM: 141 mmol/L (ref 135–145)

## 2014-12-19 ENCOUNTER — Other Ambulatory Visit (HOSPITAL_COMMUNITY): Payer: Self-pay

## 2014-12-19 LAB — BASIC METABOLIC PANEL
Anion gap: 9 (ref 5–15)
BUN: 21 mg/dL (ref 6–23)
CALCIUM: 8.8 mg/dL (ref 8.4–10.5)
CO2: 32 mmol/L (ref 19–32)
CREATININE: 0.87 mg/dL (ref 0.50–1.10)
Chloride: 97 mmol/L (ref 96–112)
GFR calc non Af Amer: 73 mL/min — ABNORMAL LOW (ref >90)
GFR, EST AFRICAN AMERICAN: 85 mL/min — AB (ref >90)
GLUCOSE: 88 mg/dL (ref 70–99)
POTASSIUM: 3.6 mmol/L (ref 3.5–5.1)
Sodium: 138 mmol/L (ref 135–145)

## 2014-12-19 LAB — CBC
HCT: 27.3 % — ABNORMAL LOW (ref 36.0–46.0)
Hemoglobin: 9.1 g/dL — ABNORMAL LOW (ref 12.0–15.0)
MCH: 29.4 pg (ref 26.0–34.0)
MCHC: 33.3 g/dL (ref 30.0–36.0)
MCV: 88.3 fL (ref 78.0–100.0)
Platelets: 222 10*3/uL (ref 150–400)
RBC: 3.09 MIL/uL — ABNORMAL LOW (ref 3.87–5.11)
RDW: 17.3 % — ABNORMAL HIGH (ref 11.5–15.5)
WBC: 6.9 10*3/uL (ref 4.0–10.5)

## 2014-12-20 ENCOUNTER — Other Ambulatory Visit (HOSPITAL_COMMUNITY): Payer: Medicare Other

## 2014-12-21 LAB — CLOSTRIDIUM DIFFICILE BY PCR: CDIFFPCR: POSITIVE — AB

## 2014-12-25 LAB — CBC
HCT: 27 % — ABNORMAL LOW (ref 36.0–46.0)
Hemoglobin: 9 g/dL — ABNORMAL LOW (ref 12.0–15.0)
MCH: 29.2 pg (ref 26.0–34.0)
MCHC: 33.3 g/dL (ref 30.0–36.0)
MCV: 87.7 fL (ref 78.0–100.0)
Platelets: 334 10*3/uL (ref 150–400)
RBC: 3.08 MIL/uL — ABNORMAL LOW (ref 3.87–5.11)
RDW: 16.6 % — AB (ref 11.5–15.5)
WBC: 5.3 10*3/uL (ref 4.0–10.5)

## 2014-12-25 LAB — BASIC METABOLIC PANEL
Anion gap: 9 (ref 5–15)
BUN: 20 mg/dL (ref 6–23)
CHLORIDE: 102 mmol/L (ref 96–112)
CO2: 29 mmol/L (ref 19–32)
Calcium: 9 mg/dL (ref 8.4–10.5)
Creatinine, Ser: 0.82 mg/dL (ref 0.50–1.10)
GFR, EST NON AFRICAN AMERICAN: 79 mL/min — AB (ref >90)
Glucose, Bld: 89 mg/dL (ref 70–99)
Potassium: 3.4 mmol/L — ABNORMAL LOW (ref 3.5–5.1)
Sodium: 140 mmol/L (ref 135–145)

## 2014-12-28 LAB — BASIC METABOLIC PANEL
ANION GAP: 8 (ref 5–15)
BUN: 20 mg/dL (ref 6–23)
CHLORIDE: 103 mmol/L (ref 96–112)
CO2: 27 mmol/L (ref 19–32)
CREATININE: 0.8 mg/dL (ref 0.50–1.10)
Calcium: 9.2 mg/dL (ref 8.4–10.5)
GFR calc Af Amer: >90 mL/min (ref >90)
GFR, EST NON AFRICAN AMERICAN: 81 mL/min — AB (ref >90)
GLUCOSE: 101 mg/dL — AB (ref 70–99)
Potassium: 4.4 mmol/L (ref 3.5–5.1)
Sodium: 138 mmol/L (ref 135–145)

## 2014-12-28 LAB — CBC
HCT: 27.3 % — ABNORMAL LOW (ref 36.0–46.0)
Hemoglobin: 9 g/dL — ABNORMAL LOW (ref 12.0–15.0)
MCH: 28.9 pg (ref 26.0–34.0)
MCHC: 33 g/dL (ref 30.0–36.0)
MCV: 87.8 fL (ref 78.0–100.0)
Platelets: 379 10*3/uL (ref 150–400)
RBC: 3.11 MIL/uL — ABNORMAL LOW (ref 3.87–5.11)
RDW: 17.4 % — AB (ref 11.5–15.5)
WBC: 6 10*3/uL (ref 4.0–10.5)

## 2015-02-03 NOTE — Consult Note (Signed)
59 year old female suffered left intertrochanteric hip fracture today.  Recommend open reduction and internal fixation of the fracture tomorrow in OR.  Lab results appear satisfactory.  Will discuss this with patient in AM.   Electronic Signatures: Park Breed (MD)  (Signed on 04-Nov-15 19:28)  Authored  Last Updated: 04-Nov-15 19:28 by Park Breed (MD)

## 2015-02-03 NOTE — Op Note (Signed)
PATIENT NAME:  [name], [name] MR#:  [id number] DATE OF BIRTH:  July 26, 1956  DATE OF PROCEDURE:  08/17/2014  PREOPERATIVE DIAGNOSIS: Displaced left intertrochanteric hip fracture.   POSTOPERATIVE DIAGNOSIS: Displaced left intertrochanteric hip fracture.   OPERATION: Open reduction and internal fixation of left hip with a Synthes trochanteric fixation nail (125 degrees/11 mm rod, 85 mm helical blade, 36 mm distal screw).   SURGEON: Park Breed, MD.   ANESTHESIA: Spinal.   COMPLICATIONS: None.   DRAINS: None.   ESTIMATED BLOOD LOSS: 50 mL.     REPLACEMENT: None.   DESCRIPTION OF PROCEDURE: The patient was brought to the operating room where she underwent satisfactory spinal anesthesia and was placed on the fracture table and padded appropriately. The right leg was flexed and abducted. The left leg was placed in traction and internally rotated and adducted. Fluoroscopy showed good position of the fracture reduction. The hip was prepped and draped in sterile fashion and a short longitudinal incision was made just above the trochanter. Dissection was carried out sharply through subcutaneous tissue and fascia. The guide pin was introduced through the tip of the trochanter and visualized on fluoroscopy as being in good position. The large 17 mm reamer was then used to enlarge the opening in the tip of the trochanter. A guidepin was then advanced down the shaft of the femur and the 125 degrees x 11 mm trochanteric fixation nail was passed over the guide pin and advanced into the canal. The guidepin was removed. The rod was seated at the appropriate depth and a stab wound then made distally for the guidepin. The guide for the pin was advanced to the lateral cortex and tightened. The pin was inserted and seemed to be in excellent position on AP and lateral views in the head. The lateral cortex was drilled. The path for the nail was drilled to 85 mm. An 85 mm helical blade was then inserted and the  fracture construct tightened. The locking screw was tightened from above. Fluoroscopy showed the hardware to be in excellent position and the fracture to be well reduced. The guide for the distal screw was then inserted through a separate stab wound, drilled and a 36 mm screw inserted. Fluoroscopy showed this to be in good position. Outriggers were removed and the final fluoroscopy showed the hardware and the fracture fragments to be in good position. The wounds were irrigated and closed with 0 Vicryl on fascia, 2-0 Vicryl on all subcutaneous tissues, and staples on the skin. A dry sterile dressing was applied. A drain was not left. The patient's leg was taken out of traction and was seen to move nicely. The patient was then transferred to her hospital bed and taken to recovery in good condition.    ____________________________ Park Breed, MD hem:bu D: 08/17/2014 12:39:40 ET T: 08/17/2014 14:00:18 ET JOB#: [id number]  cc: Park Breed, MD, <Dictator> Park Breed MD ELECTRONICALLY SIGNED 08/18/2014 13:58

## 2015-02-03 NOTE — H&P (Signed)
PATIENT NAME:  [name], BEALER MR#:  [id number] DATE OF BIRTH:  05-30-1956  DATE OF ADMISSION:  08/16/2014  PRIMARY CARE PHYSICIAN: [facility].   CHIEF COMPLAINT: "I had a fall."   HISTORY OF PRESENT ILLNESS: This is a 59 year old female who drinks alcohol, about a 12 pack per day. She drank more today. She has been unsteady on her feet and just had a fall. No loss of consciousness. Left leg was found to be shortened and externally rotated. In the ER, an x-ray was done that showed a left intertrochanteric femur fracture. Hospitalist services were contacted for further evaluation.   PAST MEDICAL HISTORY: Throat cancer, status post surgery and radiation; cervical cancer, undergoing radiation.   PAST SURGICAL HISTORY: A chest surgery that I think is because of the throat cancer surgery and valve replacement.   ALLERGIES: No known drug allergies.   MEDICATIONS: None.   SOCIAL HISTORY: Smokes 1/2 pack per day. Drinks a 12 pack of beer per day. No drug use. Not working currently, lives with her husband.   FAMILY HISTORY: Father died of lung cancer. Mother died of diabetic complications.  REVIEW OF SYSTEMS: CONSTITUTIONAL: Positive for hot flashes. No fever or chills. Positive for weight loss. Wears reading glasses.  EARS, NOSE, MOUTH AND THROAT: Positive for postnasal drip. Positive for sore throat. Positive for dysphagia.  CARDIOVASCULAR: No chest pain. No palpitations.  RESPIRATORY: Positive for shortness of breath, cough, sputum.  GASTROINTESTINAL: Positive for abdominal pain occasionally. No nausea. No vomiting. No diarrhea. No constipation. No bright red blood per rectum. No melena.  GENITOURINARY: No burning on urination or hematuria.  MUSCULOSKELETAL: Positive for left hip pain.  INTEGUMENT: No rashes or eruptions.  NEUROLOGIC: No fainting or blackouts.  PSYCHIATRIC: Sometimes gets angry with her husband.  ENDOCRINE: No thyroid problems.  HEMATOLOGIC AND LYMPHATIC: No anemia.   PHYSICAL  EXAMINATION:  VITAL SIGNS: Temperature 98, pulse 91, respirations 18, blood pressure 142/75, pulse oximetry 95% on room air.  GENERAL: No respiratory distress.  EYES: Conjunctivae slightly bloodshot on the right side and lids normal. Pupils equal, round and reactive to light. Extraocular muscles intact. No nystagmus. EARS, NOSE, MOUTH AND THROAT: Tympanic membranes: No erythema. Nasal mucosa: No erythema. Throat: No erythema, no exudate seen. Lips and gums: No lesions.  NECK: No JVD. No bruits. No lymphadenopathy. No thyromegaly. No thyroid nodules palpated.  LUNGS: Clear to auscultation. No use of accessory muscles to breathe. No rhonchi, rales or wheeze heard.  CARDIOVASCULAR: S1, S2 normal. No gallops, rubs or murmurs heard. Carotid upstroke 2+ bilaterally. No bruits. Dorsalis pedis pulses 2+ bilaterally. No edema of the lower extremity.  ABDOMEN: Soft, nontender. No organomegaly/splenomegaly. Normoactive bowel sounds. No masses felt.  LYMPHATIC: No lymph nodes in the neck.  MUSCULOSKELETAL: No clubbing, edema or cyanosis. Left leg shortened and externally rotated.  SKIN: No rashes or ulcers.  NEUROLOGIC: Cranial nerves II-XII grossly intact. Deep tendon reflexes not tested with hip fracture.  PSYCHIATRIC: The patient is oriented to person, place and time.   LABORATORY AND RADIOLOGICAL DATA: Chest x-ray shows old left rib fractures. Hip x-ray shows left intertrochanteric femur fracture. INR normal range. White blood cell count 4.3, H and H 11.9 and 36.5, platelet count of 300,000. MCV 107. Glucose 85, BUN 11, creatinine 1.06, sodium 143, potassium 4.1, chloride 105, CO2 of 28, calcium 8.9. PTT normal range. Ethanol level still pending.   EKG: Normal sinus rhythm, 98 beats per minute, QTc slightly elevated.   ASSESSMENT AND PLAN:  1.  Hip fracture status post mechanical fall with alcohol abuse. No contraindications to surgery at this time. The patient is willing to undergo the risks in order to  walk again. If the patient did not go to surgery, the patient would be high risk for death with pneumonia, blood clot and skin breakdown. The patient must end up going for surgery. No further cardiac workup needed.  2.  Alcohol abuse CIWA protocol ordered. As per the patient, she never goes through withdrawal if she does not drink. There are days where she does not drink. Will give a dose of IV thiamine here, since the orthopedic surgeon ordered lactated Ringer.  3.  Tobacco abuse. Smoking cessation counseling done 3 minutes by me. The patient refused nicotine patch.  4.  Anemia. Hemoglobin of 11.9, just one-tenth of a point lower than the normal range. Continue to monitor, especially postoperative. 5.  History of valve surgery, not on any medication.  6.  History of throat cancer, status post surgery and radiation.  7.  Cervical cancer, followed as an outpatient at [facility].  TIME SPENT ON ADMISSION: Fifty-five minutes.    ____________________________ Tana Conch. Leslye Peer, MD rjw:TT D: 08/16/2014 19:38:31 ET T: 08/16/2014 20:02:17 ET JOB#: [id number]  cc: Tana Conch. Leslye Peer, MD, <Dictator> [place] MD ELECTRONICALLY SIGNED 08/24/2014 1:36

## 2015-02-03 NOTE — Discharge Summary (Signed)
PATIENT NAME:  [name], [name] MR#:  [id number] DATE OF BIRTH:  Mar 03, 1956  DATE OF ADMISSION:  08/16/2014 DATE OF DISCHARGE:  08/21/2014  DISCHARGE DIAGNOSES: Left femur fracture, alcohol abuse and withdrawal, hypertension, anemia of chronic disease, recent acute blood loss.   PROCEDURE: Nailing of impending left femur fracture.   CONDITION: Stable.   CODE STATUS: Full code.   HOME MEDICATIONS: Please refer to the medication reconciliation list.   DIET: Low-sodium diet.   ACTIVITY: As tolerated.   FOLLOW-UP CARE: Follow with PCP within 1 to 2 weeks. Follow up with Dr. Sabra Heck within 1 to 2 weeks. In addition, since the patient has some choking while eating, the patient needs followup with GI as outpatient.   REASON FOR ADMISSION: Fall.   HOSPITAL COURSE: The patient is a 59 year old African American female with a history of throat cancer status post surgery and radiation, cervical cancer, presented to the ED with a fall. Her left leg was found to be shortened and externally rotated. ER x-ray showed left hip fracture. For detailed history and physical examination, please refer to the admission note dictated by Dr. Leslye Peer.   Laboratory data on admission date was unremarkable.   1.  Left hip fracture post mechanical fall with alcohol abuse. After admission, the patient got left hip nailing and pinning surgery by Dr. Sabra Heck. After surgery, the patient was treated with pain control and DVT prophylaxis and physical therapy. The patient tolerated physical therapy well.  2.  Alcohol abuse. The patient has been on CIWA protocol. The patient had some education and was treated with Ativan and Haldol and now symptoms are improved.  3.  Anemia of chronic disease and acute blood loss due to surgery. Stable.   The patient is clinically stable. She will be discharged to subacute rehabilitation today. I discussed the patient's discharge plan with the patient, nurse, social worker.   TIME SPENT: About 38  minutes.    ____________________________ Demetrios Loll, MD qc:ah D: 08/21/2014 12:36:17 ET T: 08/21/2014 12:49:14 ET JOB#: [id number]  cc: Demetrios Loll, MD, <Dictator> Demetrios Loll MD ELECTRONICALLY SIGNED 08/21/2014 16:55

## 2015-02-03 NOTE — Consult Note (Signed)
Brief Consult Note: Diagnosis: Left hip intertrochanteric fracture.   Patient was seen by consultant.   Recommend to proceed with surgery or procedure.   Recommend further assessment or treatment.   Orders entered.   Comments: 60 female with history of  alchohol abuse fell at home last night injuring the left hip.  Brought to Emergency Room where exam and X-rays show a mildly displaced left intertrochanteric hip fracture.  Blood alcohol level was 0.336.  Admitted for medical evaluation and surgery.  Risks and benefits of surgery were discussed at length including but not limited to infection, non union, nerve or blood vessed damage, non union, need for repeat surgery, blood clots and lung emboli, and death.  Recommend left hip nailing and patient agrees.  Exam:  Alert and oriented.  circulation/sensation/motor function good left leg with pain on range of motion.  short and rotated.  skin intact.  Patient very thin.    X-rays: as above  Rx:  open reduction and internal fixation left hip with Trochanteric Fixation Nail.  Electronic Signatures: Park Breed (MD)  (Signed [phone] 10:35)  Authored: Brief Consult Note   Last Updated: 05-Nov-15 10:35 by Park Breed (MD)

## 2015-02-11 NOTE — Consult Note (Signed)
Referring Physician:  Alba Destine :   Primary Care Physician:  Alba Destine : [facility], [address] [place], [place] [address], [place] [phone]  Reason for Consult: Admit Date: 18-Oct-2014  Chief Complaint: L leg not working  Reason for Consult: CVA   History of Present Illness: History of Present Illness:   59 yo RHD F with hx of heavy EtOH presents to [facility] secondary to L leg not being able to move.  She denies headache or back pain or L arm weakness.  Denies L facial droop or vision changes as well.  She also reports that she had a seizure yesterday in the ER.  She has had seizures in the past but the last one was 3+ years ago.  She feels fine now except for weak L leg.  ROS:  General denies complaints   HEENT no complaints   Lungs no complaints   Cardiac no complaints   GI no complaints   GU no complaints   Musculoskeletal no complaints   Extremities no complaints   Skin no complaints   Neuro no complaints   Endocrine no complaints   Psych no complaints   Past Medical/Surgical Hx:  ETOH abuse: per chart/family  Stroke:   Seizure:   left hip fracture:   Radiation Treatment, past: 2004  Chemotherapy: 2004  Throat Cancer:   Past Medical/ Surgical Hx:  Past Medical History reviewed by me as above   Past Surgical History reviewed by me as above   Home Medications: Medication Instructions Last Modified Date/Time  Ativan 1 mg oral tablet 1 tab(s) orally 3 times a day, As Needed - for Anxiety, Nervousness 07-Jan-16 09:29  metoprolol succinate 25 mg oral tablet, extended release 0.5 tab(s) orally once a day 07-Jan-16 09:29  acetaminophen-HYDROcodone 325 mg-5 mg oral tablet 1 tab(s) orally every 6 hours, As Needed, moderate pain (4-6/10) , As needed, moderate pain (4-6/10) 07-Jan-16 09:29  lisinopril 10 mg oral tablet 1 tab(s) orally once a day 07-Jan-16 09:29   Allergies:  No Known Allergies:   Allergies:  Allergies  NKDA   Social/Family History: Employment Status: unemployed  Lives With: domestic partner  Living Arrangements: apartment  Social History: heavy EtOH, occasional tob, denies illicits  Family History: + strokes, no seizrues   Vital Signs: **Vital Signs.:   07-Jan-16 16:06  Vital Signs Type Q 4hr  Temperature Temperature (F) 98.8  Celsius 37.1  Temperature Source oral  Pulse Pulse 78  Respirations Respirations 18  Systolic BP Systolic BP 694  Diastolic BP (mmHg) Diastolic BP (mmHg) 76  Mean BP 92  Pulse Ox % Pulse Ox % 99  Pulse Ox Activity Level  At rest  Oxygen Delivery Room Air/ 21 %   Physical Exam: General: thin, appears older than stated age, NAD  HEENT: normocephalic, sclera nonicteric, oropharynx clear  Neck: supple, no JVD, no bruits  Chest: CTA B, no wheezing, good movement  Cardiac: RRR, no murmurs, no edema, 2+ pulses  Extremities: no C/C/E, FROM   Neurologic Exam: Mental Status: alert but oriented x 2, mild confabulation, no aphasia, slight dysarthria  Cranial Nerves: PERRLA, EOMI, nl VF, face symmetric, tongue midline, shoulder shrug equal  Motor Exam: 5/5 B except 2/5 L LE, normal tone, no tremor  Deep Tendon Reflexes: 1+/4 B, downgoing plantars  Sensory Exam: decreased temp on L leg only, nl vibration  Coordination: F to N WNL, HTS nl on R   Lab Results: LabObservation:  07-Jan-16 09:50  OBSERVATION Reason for Test  Hepatic:  07-Jan-16 06:11   Bilirubin, Total 0.6  Bilirubin, Direct 0.1 (Result(s) reported on 19 Oct 2014 at 08:20AM.)  Alkaline Phosphatase 70 (46-116 NOTE: New Reference Range 05/02/14)  SGPT (ALT) 21 (14-63 NOTE: New Reference Range 05/02/14)  SGOT (AST) 37  Total Protein, Serum 7.3  Albumin, Serum 3.5  Routine Chem:  07-Jan-16 06:11   Magnesium, Serum  1.4 (1.8-2.4 THERAPEUTIC RANGE: 4-7 mg/dL TOXIC: > 10 mg/dL  -----------------------)  Ethanol, S. < 3 (Result(s) reported on 19 Oct 2014 at 07:16AM.)  Glucose, Serum 80   BUN 12  Creatinine (comp) 1.18  Sodium, Serum  134  Potassium, Serum 3.8  Chloride, Serum 98  CO2, Serum 25  Calcium (Total), Serum 9.0  Anion Gap 11  Osmolality (calc) 267  eGFR (African American) >60  eGFR (Non-African American)  50 (eGFR values <13m/min/1.73 m2 may be an indication of chronic kidney disease (CKD). Calculated eGFR, using the MRDR Study equation, is useful in  patients with stable renal function. The eGFR calculation will not be reliable in acutely ill patients when serum creatinine is changing rapidly. It is not useful in patients on dialysis. The eGFR calculation may not be applicable to patients at the low and high extremes of body sizes, pregnant women, and vegetarians.)  Routine Hem:  07-Jan-16 06:11   WBC (CBC) 4.3  RBC (CBC)  3.58  Hemoglobin (CBC)  11.6  Hematocrit (CBC) 35.1  Platelet Count (CBC) 230 (Result(s) reported on 19 Oct 2014 at 06:38AM.)  MCV 98  MCH 32.3  MCHC 33.0  RDW  15.6   Radiology Results: [place]    07-Jan-16 08:36, [place] Doppler Bilateral  [place] Doppler Bilateral   REASON FOR EXAM:    cva  COMMENTS:       PROCEDURE: [place] - [place]OTID DOPPLER BILATERAL  - Oct 19 2014  8:36AM     CLINICAL DATA:  59year old female with a history of cerebral  vascular accident.    Cardiovascular risk factors include prior stroke/ TIA, tobacco use.    EXAM:  BILATERAL CAROTID DUPLEX ULTRASOUND    TECHNIQUE:  GPearline Cablesscale imaging, color Doppler and duplex ultrasound were  performed of bilateral carotid and vertebral arteries in the neck.    COMPARISON:  No prior comparison available    FINDINGS:  Criteria: Quantification of carotid stenosis is based on velocity  parameters that correlate the residual internal carotid diameter  with NASCET-based stenosis levels, using the diameter of the distal  internal carotid lumen as the denominator for stenosis measurement.    The following velocity measurements were  obtained:    RIGHT    ICA:  Systolic 75 cm/sec, Diastolic 29 cm/sec  CCA:  65 cm/sec    SYSTOLIC ICA/CCA RATIO:  1.1    DIASTOLIC ICA/CCA RATIO:  0.9    ECA:  89 cm/sec    LEFT    ICA:  Systolic 75 cm/sec, Diastolic 27 cm/sec    CCA:  80 cm/sec    SYSTOLIC ICA/CCA RATIO:  2.4  DIASTOLIC ICA/CCA RATIO:  1.9    ECA:  82 cm/sec    RIGHT CAROTID ARTERY: Atherosclerotic changes of the right common  carotid artery, with calcifications on the anterior wall. Low  resistance waveform maintained. Heterogeneous plaque at the right  carotid bifurcation extending into the ICA. No significant  calcifications. Low resistance waveform of the right ICA.    RIGHT VERTEBRAL ARTERY: Antegrade flow with low resistance waveform.  LEFT CAROTID ARTERY: Mild atherosclerotic changes of the left common  carotid artery. Intermediate waveform maintained. Moderate  heterogeneous plaque at the left carotid bifurcation extending into  the left ICA. Low resistance waveform of the left ICA with mild  tortuosity.    LEFT VERTEBRAL ARTERY:  Antegrade flow with low resistance waveform.     IMPRESSION:  Color duplex indicates minimal heterogeneous plaque, with no  hemodynamically significant stenosis by duplex criteria in the  extracranial cerebrovascular circulation.    Signed,    Dulcy Fanny. Earleen Newport, DO    [facility]  [facility]      Electronically Signed    By: Corrie Mckusick D.O.    On: 10/19/2014 08:51         Verified By: Johny Shears, M.D.,  MRI:    07-Jan-16 14:05, MRI Brain Without Contrast  MRI Brain Without Contrast   REASON FOR EXAM:    acute right parietal cva  COMMENTS:       PROCEDURE: MR  - MR BRAIN WO CONTRAST  - Oct 19 2014  2:05PM     CLINICAL DATA:  Altered mental status. Confusion. History of stroke.  Cardiac valve replacement.    EXAM:  MRI HEAD WITHOUTCONTRAST    TECHNIQUE:  Multiplanar, multiecho pulse  sequences of the brain and surrounding  structures were obtained without intravenous contrast.  COMPARISON:  CT head 10/19/2014    FINDINGS:  Acute infarct in the right lower parietal lobe involving cortex and  white matter. Small area of acute infarct in the right parietal  white matter superiorly. No other acute infarct.    Moderate atrophy.    Extensive chronic ischemic change. Chronic infarcts in the  cerebellum bilaterally left greater than right. Chronic ischemia  throughout the pons. Chronic infarcts in the basal ganglia  especially the left thalamus. Extensive chronic ischemic changes in  the cerebral white matter.  Chronic microhemorrhage right cerebellum, right temporal lobe, and  left parietal lobe.    Negative for mass lesion.     IMPRESSION:  Acute infarct in the right lower parietal lobe. Possible embolic  infarct given the history of cardiac valve replacement and multiple  areas of chronic ischemic change.    Atrophy and advanced chronic ischemic change.      Electronically Signed    By: Franchot Gallo M.D.    On: 10/19/2014 14:30         Verified By: Truett Perna, M.D.,  CT:    07-Jan-16 06:45, CT Head Without Contrast  CT Head Without Contrast   REASON FOR EXAM:    seizure  COMMENTS:       PROCEDURE: CT  - CT HEAD WITHOUT CONTRAST  - Oct 19 2014  6:45AM     CLINICAL DATA:  Seizure. Incontinent of bowel in bladder. History of  throat cancer with chemotherapy and radiation therapy. Recent left  hip surgery.    EXAM:  CT HEAD WITHOUT CONTRAST    TECHNIQUE:  Contiguous axial images were obtained from the base of the skull  through the vertex without intravenous contrast.  COMPARISON:  02/25/2008.    FINDINGS:  Diffuse cerebral atrophy. Ventricular dilatation consistent with  central atrophy. Cavum septum pellucidum. Low-attenuation changes  throughout the deep white matter consistent with small vessel  ischemia. Old infarct in the left  cerebellum. There is a new area of  low attenuation with sulcal effacement in the right parietal lobes  consistent with  acute infarct in the distribution of the right  posterior middle cerebral artery. No midline shift. No abnormal  extra-axial fluid collections. Gray-white matter junctions are  distinct. Basal cisterns are not effaced. No evidence of acute  intracranial hemorrhage. No depressed skull fractures. Visualized  paranasal sinuses and mastoid air cells are not opacified. Vascular  calcifications.   IMPRESSION:  New area of low attenuation with associated sulcal effacement in the  right parietal lobe consistent with acute infarct in the  distribution of the middle cerebral artery. No acute intracranial  hemorrhage. Old infarct in the left cerebellum. Diffuse atrophy and  small vessel ischemic changes.      Electronically Signed    By: Lucienne Capers M.D.    On: 10/19/2014 06:56         Verified By: Neale Burly, M.D.,   Radiology Impression: Radiology Impression: MRI personally reviewed by me and shows a R pareital watershed infarct, severe white matter changes   Impression/Recommendations: Recommendations:   prior notes personally reviewed by me personally reviewed by me   R acute parietal infarct-  symptomatic causing L leg weakness, etiology is likely embolic Wernicke encephalopathy-  mild but still present vs. vascular dementia Severe white matter changes-  likley affecting cognition as well Seizure-  likely provoked by acute stroke and low magnesium;  should not recur CTA of head and neck echo pending, if neg; pt will need TEE lipids pending continue Keppra 540m BID x 1 week, then 2576mBID x 1 week, then stop continue ASA 8142maily keep Mg >2, Ca > 8 and Na > 130 -  start thiamine 300m4m daily continue PT/OT no driving or operating heavy machinery x 6 months will follow  Electronic Signatures: SmitJamison Neighbor)  (Signed 07-Jan-16 19:10)  Authored:  REFERRING PHYSICIAN, Primary Care Physician, Consult, History of Present Illness, Review of Systems, PAST MEDICAL/SURGICAL HISTORY, HOME MEDICATIONS, ALLERGIES, Social/Family History, NURSING VITAL SIGNS, Physical Exam-, LAB RESULTS, RADIOLOGY RESULTS, Recommendations   Last Updated: 07-Jan-16 19:10 by SmitJamison Neighbor)

## 2015-02-11 NOTE — Consult Note (Signed)
PATIENT NAME:  [name], [name] MR#:  [id number] DATE OF BIRTH:  1956-06-24  DATE OF CONSULTATION:  11/20/2014  CONSULTING PHYSICIAN:  Leotis Pain, MD  REASON FOR CONSULTATION:  Seizure-like activity.   HISTORY OF PRESENT ILLNESS:  This is 59 year old African American female with a past medical history of hypertension, alcohol abuse, recent admission to [facility] in January 2016 with stroke and seizure-like activity. At that time the blood thickening medication was started but it was for acute seizure prophylaxis and it was discontinued when the patient was discharged.  She presented with multiple seizure episodes as well as generalized tonic-clonic seizure activity in the Emergency Department, status post Ativan.  The patient was found in her bedroom with seizure activity. CAT scan of the head done, no acute intracranial abnormality. On further work-up the patient is a drinker, but her alcohol level was only 3.   PAST MEDICAL HISTORY: Alcohol, hypertension, throat cancer, cervical cancer, and anemia of chronic disease.   HOME MEDICATIONS: Have been reviewed.   VITAL SIGNS: Reviewed.   REVIEW OF SYSTEMS:  Unable to obtain as the patient is sleepy.   NEUROLOGICAL EVALUATION: The patient appears to be sedated. The patient is status post Ativan, but does wake up with painful stimuli. Extraocular movements are intact.   HISTORY OF PRESENT ILLNESS: This patient is able to track me through the room. Motor strength: Generalized weakness, 4 to 4+ out of 5 bilateral upper and lower extremities but the patient moves her extremities symmetrically. Coordination and gait could not be assessed.   IMPRESSION: A 59 year old African American female with history of strokes and seizure history suspected of possible strokes versus alcohol withdrawal, comes in with multiple seizure episodes.  Currently seizure-free, the patient was started on Keppra 500 mg q.12 hours.   PLAN: We will continue  the Keppra 500 mg q.12 hours. The patient is waking up. Would not recommend any further imaging, such as EEG or MRI, would try to hold off benzodiazepines and if needed would start either Seroquel or Risperdal. Possible EtOH withdrawal, continue CIWA protocol, daily thiamine and daily folate. If difficult, patient and his combative will consider Precedex drip, which will help combat EtOH withdrawal, if needed.   Thank you; it was a pleasure seeing this patient. Please call with any questions.   ____________________________ Leotis Pain, MD yz:at D: 11/20/2014 16:29:16 ET T: 11/20/2014 17:14:56 ET JOB#: [id number]  cc: Leotis Pain, MD, <Dictator> Leotis Pain MD ELECTRONICALLY SIGNED 11/21/2014 12:12

## 2015-02-11 NOTE — H&P (Signed)
PATIENT NAME:  [name], AMBLE MR#:  [id number] DATE OF BIRTH:  12/02/1955  DATE OF ADMISSION:  10/19/2014  CHIEF COMPLAINT: Seizure.   HISTORY OF PRESENTING ILLNESS: A 59 year old African American female patient with history of hypertension, alcohol abuse, and recent left femur fracture status post repair, presents to the Emergency Room, brought in by the husband after he noticed that she had generalized tonic-clonic seizure lasting about 1 to 2 minutes. She had 2 episodes of these. The patient was normal prior to going to sleep. He noticed that in sleep she had these episodes of seizures along with incontinence of both bowel and bladder. He brought here in the Emergency Room. The patient felt back to baseline. CT scan of the head shows a right parietal area acute CVA and  patient is being admitted to the hospitalist service. She did receive a dose of IV phenytoin 1000 mg in the Emergency Room. No further seizures. She does not complain of any focal weakness, sensory deficits, dysphagia, change in vision. No shortness of breath, nausea, vomiting. The patient was recently in the hospital for a left femur fracture status post repair, discharged to rehab on 08/21/2014, and then returned home.   PAST MEDICAL HISTORY:  1.  Alcohol abuse.  2.  Hypertension.  3.  Throat cancer, status post surgery, radiation.  4.  Cervical cancer.  5.  Anemia of chronic disease.   PAST SURGICAL HISTORY: Chest surgery for her throat cancer and valve replacement.   ALLERGIES: No known drug allergies.   SOCIAL HISTORY: The patient continues to smoke 1/2 pack a day. Drinks 6 to 12 beers a day. No illicit drug use. Lives with her boyfriend.   FAMILY HISTORY: Father died of lung cancer. Mother died of diabetic complications.   HOME MEDICATIONS:  1.  Acetaminophen/hydrocodone 325/5 mg 1 tablet every 6 hours as needed.  2.  Ativan 1 mg oral 3 times a day as needed.  3.  Lisinopril 10 mg daily.  4.  Metoprolol succinate 25 mg  1/2 tablet daily.   REVIEW OF SYSTEMS:  CONSTITUTIONAL: No fever or fatigue.  EYES: No blurred vision, pain, redness.  ENT: No tinnitus, ear pain, hearing loss.  RESPIRATORY: No cough, wheeze, hemoptysis.  CARDIOVASCULAR: No chest pain, orthopnea, edema.  GASTROINTESTINAL: No nausea, vomiting, diarrhea, abdominal pain.  GENITOURINARY: No dysuria or hematuria.  ENDOCRINE: No polyuria or nocturia.  NEUROLOGIC: No focal numbness or weakness. Had a seizure.  PSYCHIATRIC: Has some anxiety.   PHYSICAL EXAMINATION:  VITAL SIGNS: Temperature 98.4, pulse 86, blood pressure 141/99, saturating 98% on room air.  GENERAL: Thin, African American female patient lying in bed, seems comfortable, conversational, cooperative with exam.  PSYCHIATRIC: Alert and oriented x 3. Mood and affect appropriate. Judgment intact.  HEENT: Atraumatic, normocephalic. Oral mucosa moist and pink. External ears and nose normal. Pallor positive. No icterus. Pupils bilaterally equal and reactive to light.  NECK: Supple. No thyromegaly. No palpable lymph nodes. Trachea midline. No carotid bruit or JVD.  CARDIOVASCULAR: S1, S2, without any murmurs. Peripheral pulses 2+. No edema.  RESPIRATORY: Normal work of breathing. Clear to auscultation on both sides.  GASTROINTESTINAL: Soft abdomen, nontender, bowel signs present. No organomegaly palpable.  SKIN: Warm and dry. No petechiae, rash, ulcers.  MUSCULOSKELETAL: No joint swelling, redness, effusion of the large joints. Normal muscle tone.  NEUROLOGICAL: Motor strength 5/5 in upper and lower extremities. Sensation was intact all over.  LYMPHATIC: No cervical lymphadenopathy.   LABORATORY DATA: Glucose 80, BUN 12, creatinine  1.18, sodium 134, potassium 3.8. Alcohol less than 3. Magnesium 1.4. AST, ALT, alkaline phosphatase, bilirubin normal. WBC 4.3, hemoglobin 11.6, platelets of 230,000.   CT scan of the head without contrast shows diffuse cerebral atrophy, which is chronic. New  area of low attenuation with associated sulcal effacement, right parietal lobe, consistent with acute infarct of the MCA territory. Old infarction, left cerebellum.   ASSESSMENT AND PLAN:  1.  Acute right parietal middle cerebral artery territory stroke. The patient also had associated seizure with this stroke. At this point, she has received a dose of phenytoin in the Emergency Room, but with her history of alcoholism, she might have underlying liver disease. We will put her on Keppra, consult neurology. Start her on aspirin, statin, neurologic checks every 4 hours. Consult physical therapy and speech. Permissive hypotension: Hold lisinopril and metoprolol.  Lovenox for deep vein thrombosis prophylaxis. Check echocardiogram and carotid Dopplers. Get an MRI of the brain. Fall and seizure precautions.  2.  Hypertension: Apparently medications on hold for permissive hypotension.  3.  Alcohol abuse: The patient mentions that she has decreased the amount of alcohol she has been drinking since discharge from rehabilitation. We will watch and if she has any withdrawals, put  her on CIWA protocol.  4.  Deep vein thrombosis prophylaxis with Lovenox.   CODE STATUS: FULL CODE.   TIME SPENT ON THIS CASE: 45 minutes.   ____________________________ Leia Alf Hagen Tidd, MD srs:ts D: 10/19/2014 11:52:58 ET T: 10/19/2014 12:21:03 ET JOB#: [id number]  cc: Alveta Heimlich R. Candra Wegner, MD, <Dictator> Neita Carp MD ELECTRONICALLY SIGNED 10/27/2014 12:52

## 2015-02-11 NOTE — Consult Note (Signed)
PATIENT NAME:  [name], SPADAFORA MR#:  [id number] DATE OF BIRTH:  10/30/1955  DATE OF CONSULTATION:  11/21/2014  CONSULTING PHYSICIAN:  Gonzella Lex, MD  IDENTIFYING INFORMATION AND REASON FOR CONSULTATION: A 59 year old woman with a history of alcohol abuse, who came in to the hospital after having seizures at home and seizures here in the hospital. Currently in the critical care unit. Consult for alcohol abuse.   HISTORY OF PRESENT ILLNESS: Information obtained primarily from the chart and also from conversation with the nursing staff. I also spoke with the patient's sister, (Dictation Anomaly)<< MISSING TEXT>>by phone. The patient was brought into the hospital on February 8 by EMS. At that time there was a history that she had had what sounds like seizures at home. She continued to have seizures in the Emergency Room before it was controlled with Ativan. The patient herself has not been in any shape to give history.  According to the chart, the fiance said that she had been drinking "a couple beers" the previous day. Her blood alcohol level on presentation here was undetectable. Her sister tells me that she suspects that it is very likely that [name] has been drinking heavily. She says that was why [name] had been in a hurry to get out of the rehabilitation that she had been sent to recently. She had been home for at least several days. No other known interval history, except that the sister says that the patient was not taking the medication that she was supposed to take and was "not taking care of herself."   PAST PSYCHIATRIC HISTORY: We have admission notes in our records going back to 2009.  Throughout that time, it has been indicated that she has a history of alcohol abuse. On at least 1 occasion it has been stated that she has a history of delirium tremens. She has had multiple complications from her condition including a broken leg earlier this year. She was admitted to the hospital in  January and at that time, had been found to have a new stroke. 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   SUBSTANCE ABUSE HISTORY: Long history of alcohol abuse. At this point I do not know whether she has ever had any sustained sobriety. It does not look like psychiatry has been consulted during her stays here, and I do not see that it has necessarily been directly addressed. It is possible she may have had treatment in the past. No history identified of any other drug abuse. No history of other psychiatric illnesses identified.   PAST MEDICAL HISTORY: As mentioned, she has a new documented stroke. Additionally, I see that her MRI today shows that she has had an expansion of that and probably another stroke in the interim. She had a past history of throat cancer with surgery and radiation, but it looks like that was in the distant past. Chronic anemia, high blood pressure. When she was discharged from the hospital in January, she was supposed to be taking metoprolol 25 mg 1/2 tablet a day, Ativan 1 mg 3 times a day as needed, aspirin 81 mg a day, Keppra 500 mg with the plan to gradually taper off, lovastatin 40 mg a day, magnesium oxide 400 mg a day and vitamin B12.   ALLERGIES: No known drug allergies.   FAMILY HISTORY: Unknown.   SOCIAL  HISTORY: Evidently lives with a boyfriend,  who it is reported also has a heavy drinking pattern.   REVIEW OF SYSTEMS: The patient is not able to speak or offer any information.   MENTAL STATUS EXAMINATION: Critically ill-appearing woman in the critical care unit. She was lying perfectly still. She did not respond when I spoke her name. I did not shake her because I did not see any benefit in trying to agitate her right now. Nursing reports that the patient has been fighting, agitated, and confused earlier when she was awake.    IMAGING:  MRI done today does show an expansion of her last stroke and probably another new one.   LABORATORY DATA: Today's potassium is elevated at 5.5. She has today a white count elevated at 11.7, platelet count 210,000. Drug screen on admission was negative. It did not look like the urine was infected.   VITAL SIGNS: Most recently measured blood pressure 93/68, respirations 30, pulse 80, temperature 98.6.   ASSESSMENT: This is a 59 year old woman with a history of multiple medical problems including new onset strokes, who came in to the hospital having multiple seizures. Several etiologies for this including the obvious alcohol withdrawal, as well as the new onset strokes. Unclear whether she is at elevated risk for ongoing seizures even after the alcohol withdrawal. Right now she is on a Precedex drip and is unconscious in the critical care unit. Probably having delirium tremens would be quite likely at this juncture in time.   TREATMENT PLAN: Psychiatrically, I would recommend appropriate management for delirium tremens. The Precedex drip is perfectly reasonable and a handy way to do this given how easily it can be titrated. On the other hand, at some point I imagine we will want to get her off of that so that she will not have to be in the critical care unit if it is not otherwise necessary. I would recommend that over the next couple days gradually the Precedex drip can be titrated down if the nurses find that it is possible without the patient getting dangerously agitated. Probably good to start replacing it with some benzodiazepines instead. I am going to go ahead and put her on standing doses of IV Ativan which hopefully will help to facilitate being able to decrease the Precedex. No need for antipsychotics at this point. Agitation should probably best be managed as for DTs. Longer term, I will have to see what her mental status is like to see what kind of substance abuse treatment is  appropriate.   DIAGNOSIS, PRINCIPAL AND PRIMARY:  AXIS I: Delirium due to withdrawal from alcohol.   SECONDARY DIAGNOSES: AXIS I:  1.  Delirium, possibly due to other medical problems.  2.  Alcohol abuse, severe.    ____________________________ Gonzella Lex, MD jtc:LT D: 11/21/2014 18:19:57 ET T: 11/21/2014 19:11:47 ET JOB#: [id number]  cc: Gonzella Lex, MD, <Dictator> Gonzella Lex MD ELECTRONICALLY SIGNED 11/22/2014 17:29

## 2015-02-11 NOTE — Discharge Summary (Signed)
PATIENT NAME:  [name], [name] MR#:  [id number] DATE OF BIRTH:  12/12/1955  DATE OF ADMISSION:  11/20/2014 DATE OF DISCHARGE:  11/24/2014  The patient is being transferred to [facility] in [place].   PRESENTING COMPLAINT: Altered mental status.   CURRENT DIAGNOSES:  1.  Acute encephalopathy secondary to seizures, likely alcohol-related and due to recurrent cerebrovascular accident as noted on MRI brain. EEG negative for active seizures.  2.  Acute hypoxic respiratory failure, suspect aspiration pneumonia bilateral with possible pulmonary edema with severe cardiomyopathy, ejection fraction around 30% with severe wall motion abnormality, global.  3.  History of chronic alcoholism.  4.  Recent cerebrovascular accident in January 2016.  5.  Hypertension.  6.  Gastroesophageal reflux disease.   CONDITION ON DISCHARGE: Guarded.     CONSULTATIONS:  1.  Pulmonary consultation, Dr. Mortimer Fries.  2.  Palliative care consultation, Dr. Ermalinda Memos.   CURRENT LABORATORY DATA: Glucose is 119, BUN 12, creatinine is 0.9, sodium is 139, potassium is 3.7, chloride is 107, bicarbonate is 23, phosphorus is 3.8, magnesium 1.4. Hemoglobin and hematocrit is 9.7 and 28.5, white count is 7.0, platelet count is 182,000. Sputum culture, moderate gram-negative rods.   Echo-Doppler done on 10th  February shows EF of 30% to 35%, moderately decreased global left ventricular systolic function, severe hypokinesis of anterior anteroseptal and apical wall, impaired relaxation pattern of LV diastolic filling. Moderate to mild aortic valve stenosis. Echo in January 2016 showed EF of  55% to 60%. MRI brain done on February 9 shows evolution of right periarterial infarct, now with encephalomalacia and laminar necrosis. New small acute nonhemorrhagic superolateral right cerebellar infarct. Remote bilateral cerebellar infarct, larger on the left with encephalomalacia. Remote corona radiata basal ganglia and left thalamic small infarcts.  Remote small left cerebral peduncle infarct. Global atrophy.    CURRENT MEDICATIONS: 1.  IV fentanyl drip.  2.  IV Levophed drip.  3.  Cyanocobalamin 2000 mcg daily.  4.  Lovastatin 40 mg daily.  5.  Magnesium oxide 400 mg daily.  6.  Lorazepam 2 mg IV q. 2 p.r.n. for CIWA protocol.  7.  Aspirin 81 mg daily.  8.  Clindamycin 600 mg IV q. 8 hourly.  9.  Lovenox 40 mg subcutaneous daily.  10.   Docusate 50 mg b.i.d.  11.   Folic acid 147 mcg daily.  12.   Probiotic capsule 1 p.o. t.i.d.  13.   Keppra 500 mg b.i.d.  14.   Multivitamin liquid 5 mL daily.  15.   Senna liquid 8.8 mg b.i.d.  16.   Thiamine 100 mg daily.  17.   Clonazepam 1 mg b.i.d.  18.   Famotidine 20 mg b.i.d.  19.   Insulin sliding scale.  20.   Bisacodyl 10 mg rectal once.   BRIEF SUMMARY OF HOSPITAL COURSE: [name] is a 59 year old African American female well-known to our service from previous admission with history of old CVA, chronic alcoholism who comes in with:  1.  Altered mental status, possibly due to seizures with witnessed seizures at home. The patient was admitted in the Intensive Care Unit and was started on IV CIWA protocol for her possible alcohol-related seizures and IV Keppra. She was seen by neurology. EEG did not show any active seizures. The patient currently is intubated on the ventilator.  2.  Acute hypoxic respiratory failure, suspect aspiration pneumonia with positive sputum cultures bilateral with possible pulmonary edema, acute cardiomyopathy, EF of 30% with severe global wall motion abnormality.  The patient's cardiomyopathy appears stress-induced. This was discussed with cardiology. She has been receiving Lasix on needed basis. Good urine output. She was intubated, placed on the ventilator given her hypoxia and bilateral aspiration pneumonia, started on IV clindamycin. The patient has been intubated February 10. The patient also has history of mitral valve replacement bovine, which was done at  [facility].  3.  History of chronic alcoholism. Urine drug screen was negative. Continue CIWA protocol. The patient apparently drinks a lot of alcohol at home. She is currently intubated and on IV fentanyl drip along with p.r.n. Ativan.  4.  Recent CVA in January 2016. Recurrent CVA at this time, which was noted on MRI of the brain. Aspirin and statins have been continued.  5.  Hypertension, stable.  6.  Deep vein thrombosis prophylaxis, subcutaneous Lovenox.  7.  GI prophylaxis with Protonix pump inhibitor.   NUTRITION: The patient is currently getting tube feeding. She is getting Vital High Protein RTH 49 mL per hour. Tube feeding per protocol.   Palliative care consultation was placed. The patient remains a full code. She has a niece, [name], and her sister with her who have been updated. They are aware of the patient's critical nature of illness. They are also aware of cardiorespiratory arrest and various complications that can occur regarding her current medical situation. The patient's family is agreeable with the patient being discharged to [facility] in [place].   TIME SPENT: 40 minutes.    ____________________________ Hart Rochester Posey Pronto, MD sap:at D: 11/24/2014 11:48:00 ET T: 11/24/2014 12:02:31 ET JOB#: [id number]  cc: Mella Inclan A. Posey Pronto, MD, <Dictator> Ilda Basset MD ELECTRONICALLY SIGNED 12/12/2014 17:29

## 2015-02-11 NOTE — Consult Note (Signed)
Psychiatry: Follow-up note for patient with alcohol withdrawal, alcohol dependence, history of stroke.  This morning the patient required intubation.  She is currently intubated and sedated.  Unresponsive at this time.  Vital signs remaining stable.  Agree with discontinuing specific alcohol withdrawal treatment at this point while focusing on medical stability.  Will follow-up as needed.  Electronic Signatures: Fred Franzen, Madie Reno (MD)  (Signed on 10-Feb-16 16:18)  Authored  Last Updated: 10-Feb-16 16:18 by Gonzella Lex (MD)

## 2015-02-11 NOTE — Consult Note (Signed)
General Aspect Primary Cardiologist: New to [facility] _______________  59 year old female with history of severe mitral regurgitation s/p MVR at [facility] 2009 (card indicates Bovine 25 mm MVR placed on 03/19/2008), HTN, alcohol abuse, ongoing tobacco abuse, recent left femur fracture s/p repair who presented to [facility] on 10/19/2014 after suffering a 2 episodes of generalized tonic-clonic seizure lasting about 1 to 2 minutes. Upon her arrival to [facility] she was also noted to have suffered an acute right parietal area stroke. She underwent a TTE on 1/7 that did not reveal obvious intracardiac evidence of thrombi or shunt. Cardiology is consulted for TEE.  _____________  PMH: 1. severe mitral regurgitation s/p MVR at [facility] 2009 (card indicates 25 mm Bovine MVR placed on 03/19/2008) - mod # [id number] 2. HTN 3. Alcohol abuse 4. Ongoing tobacco abuse 5. Recent left femur fracture s/p repair 6. Throat cancer  status post surgery, radiation 7. Anemia of chronic disease 8. Cervical cancer 9. History of seizure disorder (3+ years ago) ______________   Present Illness 59 year old female with the above problem list who has history of severe mitral valve regurgitation s/p MVR in 2009 at [facility] (card indicates 25 mm Bovine MVR placed on 03/19/2008). She has not seen a cardiologist since that time.  She does not have her mitral valve repalcement card with her at this time. It is in her purse which is at her sister's house. She has placed a phone call out to her sister to bring it by today.   Patient was recently admitted to [facility] for a left femur fracture status post repair, discharged to rehab on 08/21/2014, and then returned home. She was doing well up until the below events.   Patient was at her house with her husband when she developed 2 episodes of generalized tonic-clonic seizures, both lasting approximately 1-2 minutes. he patient was normal prior to going to sleep. He noticed that in sleep she had these episodes of seizures  along with incontinence of both bowel and bladder. She has a history of seizures, last one occuring approximately 3+ years ago. She was brought to the [facility] ED for further evaluation which showed an acute right parietal area stroke. She did receive a dose of IV phenytoin 1000 mg in the Emergency Room. No further seizures. She does not complain of any focal weakness, sensory deficits, dysphagia, change in vision. No shortness of breath, nausea, vomiting.  She was placed on Keppra. TTE technically difficult study, EF 60-65%, mitral valve appeared thickened but not well visualized. Neurology was consulted and advised likely embolic stroke, plan for CTA of head and neck, continue Keppra, and aspirin 81 mg. Plan for TEE. She is currently resting comfortably in bed.   Physical Exam:  GEN well developed, well nourished, no acute distress, thin   HEENT hearing intact to voice   NECK supple   RESP normal resp effort  clear BS   CARD Regular rate and rhythm  Murmur   Murmur Systolic  c/w MVR   ABD denies tenderness  soft   EXTR negative edema   SKIN normal to palpation   NEURO cranial nerves intact   PSYCH alert, A+O to time, place, person, good insight   Review of Systems:  Subjective/Chief Complaint weakness   General: Fatigue  Weakness   Skin: No Complaints   ENT: No Complaints   Eyes: No Complaints   Neck: No Complaints   Respiratory: No Complaints   Cardiovascular: No Complaints   Gastrointestinal: No  Complaints   Genitourinary: No Complaints   Vascular: No Complaints   Musculoskeletal: weakness   Neurologic: Dizzness   Hematologic: No Complaints   Endocrine: No Complaints   Psychiatric: No Complaints   Review of Systems: All other systems were reviewed and found to be negative   Medications/Allergies Reviewed Medications/Allergies reviewed   Family & Social History:  Family and Social History:  Family History Negative  father lung CA; mother DM    Social History positive ETOH, negative Illicit drugs   + Tobacco Current (within 1 year)  1.5 ppd since teenager   Place of Living Home  lives with husband     ETOH abuse: per chart/family   Stroke:    Seizure:    left hip fracture:    Radiation Treatment, past: 2004   Chemotherapy: 2004   Throat Cancer:   Home Medications: Medication Instructions Status  acetaminophen-HYDROcodone 325 mg-5 mg oral tablet 1 tab(s) orally every 6 hours, As Needed, moderate pain (4-6/10) , As needed, moderate pain (4-6/10) Active  lisinopril 10 mg oral tablet 1 tab(s) orally once a day Active  Ativan 1 mg oral tablet 1 tab(s) orally 3 times a day, As Needed - for Anxiety, Nervousness Active  metoprolol succinate 25 mg oral tablet, extended release 0.5 tab(s) orally once a day Active   Lab Results:  Thyroid:  08-Jan-16 03:18   Thyroid Stimulating Hormone 3.67 (0.45-4.50 (IU = International Unit)  ----------------------- Pregnant patients have  different reference  ranges for TSH:  - - - - - - - - - -  Pregnant, first trimetser:  0.36 - 2.50 uIU/mL)  Hepatic:  07-Jan-16 06:11   Bilirubin, Total 0.6  Bilirubin, Direct 0.1 (Result(s) reported on 19 Oct 2014 at 08:20AM.)  Alkaline Phosphatase 70 (46-116 NOTE: New Reference Range 05/02/14)  SGPT (ALT) 21 (14-63 NOTE: New Reference Range 05/02/14)  SGOT (AST) 37  Total Protein, Serum 7.3  Albumin, Serum 3.5  Routine Chem:  07-Jan-16 06:11   Magnesium, Serum  1.4 (1.8-2.4 THERAPEUTIC RANGE: 4-7 mg/dL TOXIC: > 10 mg/dL  -----------------------)  Ethanol, S. < 3 (Result(s) reported on 19 Oct 2014 at 07:16AM.)  Glucose, Serum 80  BUN 12  Creatinine (comp) 1.18  Sodium, Serum  134  Potassium, Serum 3.8  Chloride, Serum 98  CO2, Serum 25  Calcium (Total), Serum 9.0  Anion Gap 11  Osmolality (calc) 267  eGFR (African American) >60  eGFR (Non-African American)  50 (eGFR values <75m/min/1.73 m2 may be an indication of  chronic kidney disease (CKD). Calculated eGFR, using the MRDR Study equation, is useful in  patients with stable renal function. The eGFR calculation will not be reliable in acutely ill patients when serum creatinine is changing rapidly. It is not useful in patients on dialysis. The eGFR calculation may not be applicable to patients at the low and high extremes of body sizes, pregnant women, and vegetarians.)  Routine Hem:  07-Jan-16 06:11   WBC (CBC) 4.3  RBC (CBC)  3.58  Hemoglobin (CBC)  11.6  Hematocrit (CBC) 35.1  Platelet Count (CBC) 230 (Result(s) reported on 19 Oct 2014 at 06:38AM.)  MCV 98  MCH 32.3  MCHC 33.0  RDW  15.6   EKG:  EKG Interp. by me   Interpretation EKG shows sinus tachycardia, 107 bpm, no st/t changes   Radiology Results:  [place]    07-Jan-16 08:36, [place] Bilateral  [place] Doppler Bilateral   REASON FOR EXAM:    cva  COMMENTS:  PROCEDURE: [place]  - US CAROTID DOPPLER BILATERAL  - Oct 19 2014  8:36AM     CLINICAL DATA:  59 year old female with a history of cerebral  vascular accident.    Cardiovascular risk factors include prior stroke/ TIA, tobacco use.    EXAM:  BILATERAL CAROTID DUPLEX ULTRASOUND    TECHNIQUE:  Pearline Cables scale imaging, color Doppler and duplex ultrasound were  performed of bilateral carotid and vertebral arteries in the neck.    COMPARISON:  No prior comparison available    FINDINGS:  Criteria: Quantification of carotid stenosis is based on velocity  parameters that correlate the residual internal carotid diameter  with NASCET-based stenosis levels, using the diameter of the distal  internal carotid lumen as the denominator for stenosis measurement.    The following velocity measurements were obtained:    RIGHT    ICA:  Systolic 75 cm/sec, Diastolic 29 cm/sec  CCA:  65 cm/sec    SYSTOLIC ICA/CCA RATIO:  1.1    DIASTOLIC ICA/CCA RATIO:  0.9    ECA:  89 cm/sec    LEFT    ICA:  Systolic 75 cm/sec,  Diastolic 27 cm/sec    CCA:  80 cm/sec    SYSTOLIC ICA/CCA RATIO:  2.4  DIASTOLIC ICA/CCA RATIO:  1.9    ECA:  82 cm/sec    RIGHT CAROTID ARTERY: Atherosclerotic changes of the right common  carotid artery, with calcifications on the anterior wall. Low  resistance waveform maintained. Heterogeneous plaque at the right  carotid bifurcation extending into the ICA. No significant  calcifications. Low resistance waveform of the right ICA.    RIGHT VERTEBRAL ARTERY: Antegrade flow with low resistance waveform.    LEFT CAROTID ARTERY: Mild atherosclerotic changes of the left common  carotid artery. Intermediate waveform maintained. Moderate  heterogeneous plaque at the left carotid bifurcation extending into  the left ICA. Low resistance waveform of the left ICA with mild  tortuosity.    LEFT VERTEBRAL ARTERY:  Antegrade flow with low resistance waveform.     IMPRESSION:  Color duplex indicates minimal heterogeneous plaque, with no  hemodynamically significant stenosis by duplex criteria in the  extracranial cerebrovascular circulation.    Signed,    Dulcy Fanny. Earleen Newport, DO    [facility]  [facility]      Electronically Signed    By: Corrie Mckusick D.O.    On: 10/19/2014 08:51         Verified By: Johny Shears, M.D.,  MRI:    07-Jan-16 14:05, MRI Brain Without Contrast  MRI Brain Without Contrast   REASON FOR EXAM:    acute right parietal cva  COMMENTS:       PROCEDURE: MR  - MR BRAIN WO CONTRAST  - Oct 19 2014  2:05PM     CLINICAL DATA:  Altered mental status. Confusion. History of stroke.  Cardiac valve replacement.    EXAM:  MRI HEAD WITHOUTCONTRAST    TECHNIQUE:  Multiplanar, multiecho pulse sequences of the brain and surrounding  structures were obtained without intravenous contrast.  COMPARISON:  CT head 10/19/2014    FINDINGS:  Acute infarct in the right lower parietal lobe involving cortex and  white  matter. Small area of acute infarct in the right parietal  white matter superiorly. No other acute infarct.    Moderate atrophy.    Extensive chronic ischemic change. Chronic infarcts in the  cerebellum bilaterally left greater than right. Chronic ischemia  throughout  the pons. Chronic infarcts in the basal ganglia  especially the left thalamus. Extensive chronic ischemic changes in  the cerebral white matter.  Chronic microhemorrhage right cerebellum, right temporal lobe, and  left parietal lobe.    Negative for mass lesion.     IMPRESSION:  Acute infarct in the right lower parietal lobe. Possible embolic  infarct given the history of cardiac valve replacement and multiple  areas of chronic ischemic change.    Atrophy and advanced chronic ischemic change.      Electronically Signed    By: Franchot Gallo M.D.    On: 10/19/2014 14:30         Verified By: Truett Perna, M.D.,  Cardiology:    07-Jan-16 09:35, Echo Doppler  Echo Doppler   REASON FOR EXAM:      COMMENTS:       PROCEDURE: [facility] - ECHO DOPPLER COMPLETE(TRANSTHOR)  - Oct 19 2014  9:35AM     RESULT: Echocardiogram Report    Patient Name:   [name] Date of Exam: 10/19/2014  Medical Rec #:  [id number]      Custom1:  Date of Birth:  [birth year]   Height:       63.0 in  Patient Age:    67 years    Weight:       96.0 lb  Patient Gender: F           BSA:          1.42 m??    Indications: CVA  Sonographer:    Sherrie Sport RDCS  Referring Phys: Hillary Bow, R    Sonographer Comments: Technically very difficult study due to very poor   echo windows and The only view obtained was parasternal-due to pt not   lying still.    Summary:   1. Very challenging image quality, though grossly no source of CVA or   TIA was noted.   2. Left ventricular ejection fraction, by visual estimation, is 60 to   65%.   3. Normal global left ventricular systolic function.   4. Normal right ventricular size and systolic function.   5.  Mitral valve leaflets appear thickened, though not well visualized.   6. Normal RVSP   7. Mildly dilated left atrium.  2D AND M-MODE MEASUREMENTS (normal ranges within parentheses):  Left Ventricle:          Normal  IVSd (2D):      0.99 cm (0.7-1.1)  LVPWd (2D):     1.07 cm (0.7-1.1) Aorta/LA:                  Normal  LVIDd (2D):     3.35 cm (3.4-5.7) Aortic Root (2D): 3.10 cm (2.4-3.7)  LVIDs (2D):     2.35 cm           Left Atrium (2D): 4.00 cm (1.9-4.0)  LV FS (2D):     29.9 %   (>25%)  LV EF (2D):     58.2 %   (>50%)                                    RightVentricle:                                    RVd (2D):  1.88 cm  SPECTRAL DOPPLER ANALYSIS (where applicable):  LVOT Vmax:  LVOT VTI:  LVOT Diameter: 2.00 cm  Tricuspid Valve and PA/RV Systolic Pressure: TR Max Velocity: 1.79 m/s RA   Pressure: 5 mmHg RVSP/PASP: 17.8 mmHg  Pulmonic Valve:  PV Max Velocity: 0.90 m/s PV Max PG: 3.2 mmHg PV Mean PG:    PHYSICIAN INTERPRETATION:  Left Ventricle: The left ventricular internal cavity size was normal. LV   posterior wall thickness was normal. No left ventricular hypertrophy.   Global LV systolic function was normal. Left ventricular ejection   fraction, by visual estimation, is 60 to 65%.  Right Ventricle: Normal right ventricular size, wall thickness, and   systolic function. The right ventricular size is normal. Global RV   systolic function is normal.  Left Atrium: The left atrium is mildly dilated.  Right Atrium: The right atrium is normal in size.  Pericardium: There is no evidence of pericardial effusion.  Mitral Valve: The mitral valve is not well seen. Trace mitral valve     regurgitation is seen. MAC noted.  Tricuspid Valve: The tricuspid valve is not well seen. The tricuspid   valve is normal. Trivial tricuspid regurgitation is visualized. The   tricuspid regurgitant velocity is 1.79 m/s, and with an assumed right   atrial pressure of 5 mmHg, the estimated right  ventricular systolic   pressure is normal at 17.8 mmHg.  Aortic Valve: The aortic valve was not well seen. No evidence of aortic   valve regurgitation is seen.  Pulmonic Valve: The pulmonic valve is normal. Trace pulmonic valve   regurgitation.  Aorta: The aortic root and ascending aorta are structurally normal, with   no evidence of dilitation.    [id number] Ida Rogue MD  Electronically signed by [id number] Ida Rogue MD  Signature Date/Time: 10/19/2014/7:25:13 PM    *** Final ***    IMPRESSION: .        Verified By: Minna Merritts, M.D., MD    No Known Allergies:   Vital Signs/Nurse's Notes: **Vital Signs.:   08-Jan-16 03:45  Vital Signs Type Routine  Temperature Temperature (F) 98.3  Celsius 36.8  Temperature Source oral  Pulse Pulse 73  Respirations Respirations 18  Systolic BP Systolic BP 737  Diastolic BP (mmHg) Diastolic BP (mmHg) 66  Mean BP 78  Pulse Ox % Pulse Ox % 98  Pulse Ox Activity Level  At rest  Oxygen Delivery Room Air/ 21 %    Impression 59 year old female with history of severe mitral regurgitation s/p MVR at [facility] 2009 (card indicates Bovine 25 mm MVR placed on 03/19/2008), HTN, alcohol abuse, ongoing tobacco abuse, recent left femur fracture s/p repair who presented to [facility] on 10/19/2014 after suffering a 2 episodes of generalized tonic-clonic seizure lasting about 1 to 2 minutes. Upon her arrival to [facility] she was also noted to have suffered an acute right parietal area stroke. She underwent a TTE on 1/7 that did not reveal obvious intracardiac evidence of thrombi or shunt. Cardiology is consulted for TEE.   1. Acute right patietal stroke: -Plan for TEE to evaluate for evidence of intracardiac thrombi and/or evidence of intracardiac shunt.  -Monitor for arrhythmia while inpatient, if none seen plan for 30 day outpatient montioring -Continue with nerology recommendations  2. History of Bovine MVR 2009: -Evaluate via TEE as above  3. Hypertension: -BP  on the soft side -Monitoring  4. Hypokalemia: -Replete to 4.0  5. Ongoing tobacco abuse: -Cessation is a must  6. Alcohol abuse on CIWA protocol   Electronic Signatures: Rise Mu (PA-C)  (Signed 08-Jan-16 10:30)  Authored: General Aspect/Present Illness, History and Physical Exam, Review of System, Family & Social History, Past Medical History, Home Medications, Labs, EKG , Radiology, Allergies, Vital Signs/Nurse's Notes, Impression/Plan Ida Rogue (MD)  (Signed 08-Jan-16 13:29)  Authored: General Aspect/Present Illness, History and Physical Exam, Review of System, Family & Social History, Labs, EKG , Radiology, Impression/Plan  Co-Signer: General Aspect/Present Illness, Home Medications, Allergies   Last Updated: 08-Jan-16 13:29 by Ida Rogue (MD)

## 2015-02-11 NOTE — Discharge Summary (Signed)
PATIENT NAMECENIYA, [name] MR#:  [id number] DATE OF BIRTH:  12/23/1955  DATE OF ADMISSION:  10/19/2014 DATE OF DISCHARGE:  10/23/2014  DISCHARGE DIAGNOSES:  1.  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  2.  History of bovine mitral valve replacement. 3.  History of tobacco abuse. 4.  Alcohol abuse.  5.  B12 deficiency.  6.  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  7.  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 8.  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   DISCHARGE CONDITION: Stable.   DISCHARGE MEDICATIONS: The patient is to continue: 1.  Metoprolol succinate 25 mg p.o. half tablet once daily. 2.  Ativan 1 mg 3 times daily as needed. 3.  Aspirin 81 mg p.o. daily.  4.  Tylenol 325 mg 2 tablets every 4 hours as needed.  5.  Keppra 500 mg p.o. twice daily for 5 days then 1/2 tablet, which will be 250 mg, twice daily for 7 more days then stop.  6.  Lovastatin 40 mg p.o. daily.  7.  Magnesium oxide 400 mg p.o. daily.  8.  Cyanocobalamin, which is B12, at 100 mcg 2 tablets once daily.   The patient is not to take Tylenol with hydrocodone or lisinopril at this time.   DIET: Two gram salt, low-fat, low-cholesterol, mechanical soft with thin liquids, cut meats. General aspiration precautions including less talking when eating or drinking and clearing mouth between bites, lessen distractions during meals, moisten foods well.  ACTIVITY LIMITATIONS: As tolerated.   REFERRAL: To home health, physical therapy as well as occupational therapy.   FOLLOWUP APPOINTMENTS: With [facility] in 2-4 weeks after discharge, Dr. Rockey Situ in 2-3 days after discharge for cardiac monitor placement.   DISCHARGE INSTRUCTIONS: The patient was advised to quit smoking as well as alcohol abuse. The patient was also advised that she would need to have 60 day cardiac monitor placed and she needs to see  cardiologist, Dr. Rockey Situ, in his office for that.   RADIOLOGIC STUDIES: CT of the head without contrast 10/19/2014 showing area of low attenuation with associated cervical effacement in the right parietal lobe consistent with acute infarct in the distribution of middle cerebral artery, no acute intracranial hemorrhage, old infarct in left cerebellum was noted, diffuse atrophy and small vessel ischemic changes were also noted. MRI of the brain without contrast 10/19/2014 revealed acute infarct in the right lower parietal lobe, possible embolic infarct given the history of cardiac valve replacement and multiple areas of chronic ischemic change, atrophy and advanced chronic ischemic change was also noted. Carotid ultrasound 10/19/2014, carotid duplex indicates minimal heterogenous plaque with no hemodynamically significant stenosis by duplex criteria in extracranial cerebrovascular circulation. CT angiogram of head and neck with and without combo 10/20/2014  revealed subacute infarct in the right parietal cortex with gyriform enhancement, no hemorrhage, extensive chronic ischemic changes in the brain and cerebellum bilaterally, left vertebral artery is occluded proximally with critical institution of the small vessel at the C2 level followed by severely stenotic plaque at the dura, right vertebral artery widely patent, carotid bifurcation widely patent bilaterally and mild atherosclerotic disease, 40% diameter stenosis of right internal carotid artery above the bulb.  HOSPITAL COURSE: The patient is a 59 year old female with past medical history significant for history of alcohol/tobacco abuse, history of hypertension, who presents to the hospital with complaints of seizure disorder. Please refer to H and P dictated by Dr. Darvin Neighbours on 10/19/2014.  Apparently, the patient had generalized tonic clonic seizure which lasted approximately 1 to 2 minutes and had 2 episodes of that. She was also incontinent of bowel as well  as bladder. On arrival to the Emergency Room, she was felt to be back to baseline. However, her CT scan of the head showed acute stroke and she was admitted to the hospital for further evaluation. In the Emergency Room, she received 1 gram of IV phenytoin. In the Emergency Room, her temperature was 98.4, pulse was 86, blood pressure 141/99, saturation was 98% on room air. Physical examination revealed left lower extremity weakness. The patient's laboratory data done in the Emergency Room 10/19/2014 showed low sodium level of 134; otherwise BMP was unremarkable. The patient's magnesium level was low at 1.4. Lipid panel revealed LDL of 70, cholesterol level was 189, triglycerides were 67, HDL 106. Hemoglobin A1c was 4.8. Alcohol level was less than 3. Liver enzymes were normal. TSH was normal at 3.67. White blood cell count was 4.3, hemoglobin was 11.6, platelet count was 230,000. Vitamin B12 level was low at 194. Lupus anticoagulant comprehensive  study was unremarkable. Factor V Leiden mugtation and facrtor 2 mutation analysis are negative. EKG showed sinus tachycardia at a rate of 177, possible left atrial enlargement, borderline EKG, but no significant change since prior EKG done in November 2015.   The patient was admitted to the hospital for further evaluation. Stroke workup was initiated. Ultrasound of her carotid arteries did not show any significant abnormalities. CT scan as well as MRI did reveal a right MCA infarct in low parietal area contributing to her left lower extremity weakness. Echocardiogram was performed on the 10/19/2014, which revealed challenging image quality with grossly no source of stroke or TIA, left ventricular ejection fraction by visual estimation was noted to be 60% to 65%, normal global left ventricular systolic function, normal right ventricular size and systolic function, mitral valve leaflets appeared thickened but were not well visualized, normal right ventricular systolic  pressures, mildly dilated left atrium was also noted. The patient was seen by neurologist who felt that the patient would benefit from TEE. The patient was planned for TEE by Dr. Rockey Situ. Unfortunately, because of her quite stenotic esophagus a TEE could not be performed. Dr. Rockey Situ suggested a TEE under general anesthesia with anesthesia providing visualization of posterior oropharynx. Unfortunately, I discussed the patient's case with Dr. Fletcher Anon on 10/23/2014 and he did not feel that TEE under general anesthesia would be appropriate because of concerns of possible perforation. The patient was followed by Dr. Valora Corporal, neurologist, who felt that the patient should have 60-day cardiac monitor placed upon discharge to rule out any arrhythmias including atrial fibrillation, which would contribute to changing of her medications. For now, she is to continue aspirin therapy at 81 mg p.o. daily dose. He also recommended to continue thiamine per CIWA protocol and not to operate any heavy machinery for the next 6 months and follow up with neurology in the next 3 months The patient improved with physical therapy and was felt to be stable to discharge home with home health, physical therapy, which she unfortunately refused.   In regard to seizure, Dr. Valora Corporal felt that the patient's seizure was very likely stroke related. He recommended to continue Keppra just for a few weeks. First week, the patient is to continue 500 mg twice daily dose, but the next week she is to continue 250 mg twice daily dose, and Keppra should be stopped.  In regard  to alcohol as well as tobacco abuse, the patient was counseled and recommended to continue abstinence.   For B12 deficiency, B12 supplementation was recommended upon discharge.   In regard to left vertebral artery occlusion, no intervention was recommended.  For right ICA 40% stenosis, no recommendations were made except for  lovastatin and aspirin therapy, although the  patient may benefit from follow up with vascular surgeon as outpatient.  For history of dementia, Dr. Valora Corporal felt that the patient may have Wernicke encephalopathy versus vascular dementia or both of those conditions. Recommended supportive therapy.  On the day of discharge, the patient felt satisfactory, did not complain of any significant discomfort. She is being discharged to home with home health if she is agreeable. Her vitals on day of discharge: Temperature was 97.4, pulse was 89, respiration rate was 18, blood pressure 120/77, saturation was 97% on room air at rest. Of note, since the patient's blood pressure is marginal we did not feel that the patient should continue her blood pressure medications at this time for at least 1 week or longer if her blood pressure remains somewhat low.   TIME SPENT: Forty minutes.    ____________________________ Theodoro Grist, MD rv:TT D: 10/23/2014 18:30:43 ET T: 10/23/2014 19:59:07 ET JOB#: [id number]  cc: Theodoro Grist, MD, <Dictator> [facility] Minna Merritts, MD  [place]yer MD ELECTRONICALLY SIGNED 11/02/2014 9:34

## 2015-02-11 NOTE — H&P (Signed)
PATIENT NAME:  [name], [name] MR#:  [id number] DATE OF BIRTH:  [birth year]  DATE OF ADMISSION:  11/20/2014  REFERRING PHYSICIAN: Lurline Hare, MD  PRIMARY CARE PHYSICIAN: None.   ADMITTING PHYSICIAN: Azucena Freed, MD   CHIEF COMPLAINT: Brought by EMS with seizure activity and unresponsiveness.   HISTORY OF PRESENT ILLNESS: A 59 year old African American female with history of hypertension, alcohol abuse, recent [facility] admission in January of 2016 with CVA and seizure activity, throat cancer status post surgery and radiation, cervical cancer, anemia of chronic disease was brought by EMS unresponsive following seizure activity, which was witnessed by her fiancee at home. According to the ED physician's note, and the EMS note, the patient was found by her fiancee at home with unresponsiveness and seizure activity. Hence, EMS was called who also noted the patient with seizure activity, was given IV Ativan and brought to the Emergency Room for further evaluation. In the Emergency Room, on arrival, the patient was unresponsive but had a couple of seizure episodes witnessed by the ED physician and the staff and received further doses of IV Ativan x2 following which her seizures ceased. She also received IV Keppra and currently is seizure free. Because of her sedation, she is still unresponsive, but responsive to deep painful stimuli by withdrawing her extremities. No family member is available at this time for further history. According to the EMS note, they also could not get reliable history from the patient's spouse since he seemed to be alcohol intoxicated. According to the ED physician, the patient was in her usual state of health and was watching the Super Bowl game last night along with her fiancee. I called the number and talked with the patient's fiancee and according to the patient's fiancee she was doing alright and all of a sudden he found the patient in her bedroom with seizure activity, hence, he called  EMS. No history of any recent fever, cough, illnesses. The patient's fiancee also mentioned that she had a couple of beers yesterday during the daytime. Following her recent admission at [facility] last month, in January, with acute CVA and seizure activity, she was discharged to rehab and she came home a few days ago. CT scan of the head done in the Emergency Room revealed no acute intracranial pathology and her lab work came essentially within normal limits, except for mildly elevated creatinine of 1.2 and potassium of 3.3. Urine drug screen was negative for any toxicology and WBC count was 13.2. Serum alcohol level is less than 3. In view of her history of alcohol usage, she was placed on CIWA protocol.   PAST MEDICAL HISTORY: 1.  Alcohol abuse.  2.  Hypertension.  3.  Throat cancer status post surgery and radiation.  4.  Cervical cancer.  5.  Anemia of chronic disease.  6.  Recent CVA with seizures related to recent CVA in January of 2016.   PAST SURGICAL HISTORY: Chest surgery for her throat cancer and valve replacement.   ALLERGIES: No known drug allergies.   SOCIAL HISTORY: She is single and lives with her fiancee. She continues to smoke half pack per day, according to the old chart.  Drinks beer, according to the fiancee, on and off. No history of illicit drug usage, as per the old chart.   FAMILY HISTORY: As per the old chart, father died of lung cancer, mother died of diabetes complications.   HOME MEDICATIONS:  1.  Acetaminophen 325 mg tablet 2 tablets every 4 hours as needed for  pain. 2.  Aspirin 81 mg 1 tablet once a day,  3.  Ativan 1 mg oral tablet 1 tablet 3 times a day as needed for anxiety and nervousness.  4.  Cyanocobalamin 1000 mcg oral tablet 2 tablets once a day.  5.  Lovastatin 40 mg 1 tablet orally once a day.  6.  Magnesium oxide 400 mg 1 tablet orally once a day.  7.  Metoprolol 25 mg 1/2 tablet orally once every day.   The patient was discharged on levetiracetam,  which was tapered as per neurology's recommendation during last month's admission.  REVIEW OF SYSTEMS: Unable to obtain since the patient is not responsive secondary due to sedation and seizure activity.   PHYSICAL EXAMINATION: VITAL SIGNS: Temperature 98 degrees Fahrenheit, pulse rate 123 per minute, respirations 14 per minute, blood pressure 121/90, O2 saturation 100% on oxygen supplementation.  GENERAL: Well-developed, well-nourished, deeply sedated, not responding to oral commands, responding to deep painful stimuli by withdrawing her extremities, not in any acute distress.  HEAD: Atraumatic, normocephalic.  EYES: Pupils are equal and reactive to light. No conjunctival pallor.  NOSE: No drainage. No lesions.  EARS: No drainage. No external lesions.  ORAL CAVITY: No mucosal lesions. No exudates.  NECK: Supple. No JVD. No thyromegaly. No carotid bruit. Range of motion of neck within normal limits.  RESPIRATORY: Good respiratory effort. Not using accessory muscles of respiration. Bilateral vesicular breath sounds present. No rales or rhonchi.  CARDIOVASCULAR: S1, S2 regular. No murmurs, gallops, or clicks. Peripheral pulses equal at carotid, femoral, and pedal pulses. No peripheral edema.  GASTROINTESTINAL: Abdomen is soft and nontender. No hepatosplenomegaly. No guarding. No rigidity. Bowels present and equal in all 4 quadrants.  GENITOURINARY: Deferred.  MUSCULOSKELETAL: Range of motion adequate. Strength and tone equal bilaterally.  SKIN: Inspection within normal limits.  LYMPHATIC: No cervical lymphadenopathy.  VASCULAR: Good dorsalis pedis and posterior tibial pulses.  NEUROLOGIC: The patient is deeply sedated. Neuro examination not possible. The patient is responding to deep painful stimuli by withdrawing her extremities. PSYCHIATRIC: Again, unable to do because the patient is sedated and not responsive.   DIAGNOSTIC DATA: Labs: Serum glucose 170, BUN 14, creatinine 1.42, sodium 137,  potassium 3.3, chloride 100, bicarb 25, total calcium 9.1. Serum ethanol less than 3. Total protein 8.2, albumin 3.4, total bili 0.5, alk phos 77, AST 34, ALT 14. Troponin 0.04. Urine drug screen is negative. WBC 13.2, hemoglobin 12.1, hematocrit 38, platelet count 292,000. Prothrombin time 13.7. INR 1. Urinalysis unremarkable. Serum acetaminophen less than 2. Serum salicylate 6.5.   Imaging studies: Chest x-ray: Minimal left basilar atelectasis noted. Lungs otherwise clear.   CT of the head, noncontrast study:  1.  No acute intracranial process.  2.  Remote right parietal infarct with additional remote cerebellar and bilateral basal ganglion infarcts.  3.  Generalized cerebral atrophy with chronic microvascular ischemic changes.   EKG: Sinus tachycardia with ventricular rate of 119 beats per minute. No acute ST-T changes.   ASSESSMENT AND PLAN: A 59 year old African American female with a history of hypertension, alcohol usage, recent cerebrovascular accident and seizure activity thought to be related to cerebrovascular accident in January of 2016, brought by EMS with unresponsiveness with seizure activity witnessed by her fiancee. The patient unresponsive following seizure activity, received Ativan by EMS, also received 2 additional doses of IV Ativan in the Emergency Room for ongoing seizure activity, which was witnessed by the ED staff. The patient in deep sedation, responding to deep painful stimuli.  1.  Unresponsiveness secondary due to seizure activity and sedation because of Ativan. Per the patient's fiancee, no history of any recent illness.  2.  Seizure episodes, witnessed by the patient's fiancee, EMS and ED physician, received IV Ativan x3 doses and also received loading dose of IV Keppra. Now seizure free. The patient in deep sedation. Moving all extremities to deep painful stimuli. CT scan of the head negative for any acute intracranial process. The patient's had seizure activity during  her last month's admission with acute cerebrovascular accident and neurology's opinion was seizure activity related to the acute cerebrovascular accident. The patient used levetiracetam for a few days with a tapering course, as per the neurologist's recommendation, currently not on p.o. levetiracetam. Plan: We will admit the patient to CCU, neuro checks, continue p.o. Keppra and p.r.n. Ativan for seizure activity. We will order MRI, EEG and get neuro consultation. Blood cultures to rule out any associated sepsis ordered.  3.  History of alcohol usage. Per the patient's fiancee, had 2 beers yesterday. Serum EtOH level. less than 3. Urine drug screen negative. No withdrawal symptoms at this time. Plan: Monitor for any alcohol withdrawal symptoms. Continue CIWA protocol, initiated in the ED. 4.  Recent cerebrovascular accident, in January of 2016, with associated seizure activity, and thought to be related to CVA by the neurologist. No acute cerebrovascular accident at this time. Continue aspirin, statin.  5.  Hypertension. Stable on home medications. Continue same.  6.  Deep vein thrombosis prophylaxis with subcutaneous Lovenox.  7.  Gastrointestinal prophylaxis with proton pump inhibitor.   CODE STATUS: FULL code.  ____________________________ Juluis Mire, MD enr:sb D: 11/20/2014 06:40:15 ET T: 11/20/2014 07:27:28 ET JOB#: [id number]  cc: Juluis Mire, MD, <Dictator> Juluis Mire MD ELECTRONICALLY SIGNED 11/20/2014 16:32

## 2015-02-25 ENCOUNTER — Inpatient Hospital Stay
Admission: EM | Admit: 2015-02-25 | Discharge: 2015-02-27 | DRG: 373 | Disposition: A | Discharge status: Skilled Nursing Facility | Payer: Medicare Other | Attending: Internal Medicine | Admitting: Internal Medicine

## 2015-02-25 ENCOUNTER — Encounter: Payer: Self-pay | Admitting: Emergency Medicine

## 2015-02-25 ENCOUNTER — Emergency Department: Payer: Medicare Other

## 2015-02-25 DIAGNOSIS — A047 Enterocolitis due to Clostridium difficile: Principal | ICD-10-CM | POA: Diagnosis present

## 2015-02-25 DIAGNOSIS — Z888 Allergy status to other drugs, medicaments and biological substances status: Secondary | ICD-10-CM | POA: Diagnosis not present

## 2015-02-25 DIAGNOSIS — K529 Noninfective gastroenteritis and colitis, unspecified: Secondary | ICD-10-CM

## 2015-02-25 DIAGNOSIS — Z79891 Long term (current) use of opiate analgesic: Secondary | ICD-10-CM

## 2015-02-25 DIAGNOSIS — I509 Heart failure, unspecified: Secondary | ICD-10-CM | POA: Diagnosis present

## 2015-02-25 DIAGNOSIS — I9589 Other hypotension: Secondary | ICD-10-CM

## 2015-02-25 DIAGNOSIS — I959 Hypotension, unspecified: Secondary | ICD-10-CM | POA: Diagnosis not present

## 2015-02-25 DIAGNOSIS — F1010 Alcohol abuse, uncomplicated: Secondary | ICD-10-CM | POA: Diagnosis present

## 2015-02-25 DIAGNOSIS — Z7902 Long term (current) use of antithrombotics/antiplatelets: Secondary | ICD-10-CM | POA: Diagnosis not present

## 2015-02-25 DIAGNOSIS — R1310 Dysphagia, unspecified: Secondary | ICD-10-CM | POA: Diagnosis present

## 2015-02-25 DIAGNOSIS — Z85819 Personal history of malignant neoplasm of unspecified site of lip, oral cavity, and pharynx: Secondary | ICD-10-CM

## 2015-02-25 DIAGNOSIS — Z79899 Other long term (current) drug therapy: Secondary | ICD-10-CM

## 2015-02-25 DIAGNOSIS — Z8541 Personal history of malignant neoplasm of cervix uteri: Secondary | ICD-10-CM | POA: Diagnosis not present

## 2015-02-25 DIAGNOSIS — Z720 Tobacco use: Secondary | ICD-10-CM

## 2015-02-25 DIAGNOSIS — R531 Weakness: Secondary | ICD-10-CM | POA: Diagnosis present

## 2015-02-25 DIAGNOSIS — Z8673 Personal history of transient ischemic attack (TIA), and cerebral infarction without residual deficits: Secondary | ICD-10-CM | POA: Diagnosis not present

## 2015-02-25 DIAGNOSIS — R569 Unspecified convulsions: Secondary | ICD-10-CM | POA: Diagnosis present

## 2015-02-25 DIAGNOSIS — I10 Essential (primary) hypertension: Secondary | ICD-10-CM | POA: Diagnosis present

## 2015-02-25 HISTORY — DX: Enterocolitis due to Clostridium difficile, not specified as recurrent: A04.72

## 2015-02-25 HISTORY — DX: Malignant (primary) neoplasm, unspecified: C80.1

## 2015-02-25 HISTORY — DX: Malignant neoplasm of pharynx, unspecified: C14.0

## 2015-02-25 HISTORY — DX: Malignant neoplasm of cervix uteri, unspecified: C53.9

## 2015-02-25 LAB — CBC
HCT: 31.4 % — ABNORMAL LOW (ref 35.0–47.0)
HEMOGLOBIN: 10.1 g/dL — AB (ref 12.0–16.0)
MCH: 26.9 pg (ref 26.0–34.0)
MCHC: 32.3 g/dL (ref 32.0–36.0)
MCV: 83.4 fL (ref 80.0–100.0)
Platelets: 352 10*3/uL (ref 150–440)
RBC: 3.77 MIL/uL — ABNORMAL LOW (ref 3.80–5.20)
RDW: 17.6 % — ABNORMAL HIGH (ref 11.5–14.5)
WBC: 15.7 10*3/uL — ABNORMAL HIGH (ref 3.6–11.0)

## 2015-02-25 LAB — BASIC METABOLIC PANEL
ANION GAP: 11 (ref 5–15)
BUN: 31 mg/dL — AB (ref 6–20)
CO2: 29 mmol/L (ref 22–32)
Calcium: 9.4 mg/dL (ref 8.9–10.3)
Chloride: 103 mmol/L (ref 101–111)
Creatinine, Ser: 1.2 mg/dL — ABNORMAL HIGH (ref 0.44–1.00)
GFR calc Af Amer: 57 mL/min — ABNORMAL LOW (ref >60)
GFR calc non Af Amer: 50 mL/min — ABNORMAL LOW (ref >60)
GLUCOSE: 99 mg/dL (ref 65–99)
Potassium: 3.3 mmol/L — ABNORMAL LOW (ref 3.5–5.1)
Sodium: 143 mmol/L (ref 135–145)

## 2015-02-25 LAB — CREATININE, SERUM
Creatinine, Ser: 1.11 mg/dL — ABNORMAL HIGH (ref 0.44–1.00)
GFR calc Af Amer: >60 mL/min (ref >60)
GFR, EST NON AFRICAN AMERICAN: 54 mL/min — AB (ref >60)

## 2015-02-25 LAB — C DIFFICILE QUICK SCREEN W PCR REFLEX
C Diff antigen: POSITIVE
C Diff toxin: NEGATIVE

## 2015-02-25 LAB — TROPONIN I: Troponin I: <0.03 ng/mL (ref <0.031)

## 2015-02-25 LAB — CLOSTRIDIUM DIFFICILE BY PCR: Toxigenic C. Difficile by PCR: POSITIVE — AB

## 2015-02-25 LAB — LACTIC ACID, PLASMA: Lactic Acid, Venous: 0.7 mmol/L (ref 0.5–2.0)

## 2015-02-25 MED ORDER — HEPARIN SODIUM (PORCINE) 5000 UNIT/ML IJ SOLN
5000.0000 [IU] | Freq: Three times a day (TID) | INTRAMUSCULAR | Status: DC
  Administered 2015-02-25 – 2015-02-27 (×5): 5000 [IU] via SUBCUTANEOUS
  Filled 2015-02-25 (×5): qty 1

## 2015-02-25 MED ORDER — SODIUM CHLORIDE 0.9 % IV SOLN
INTRAVENOUS | Status: AC
  Administered 2015-02-25 – 2015-02-26 (×2): via INTRAVENOUS

## 2015-02-25 MED ORDER — SERTRALINE HCL 100 MG PO TABS
100.0000 mg | ORAL_TABLET | Freq: Every day | ORAL | Status: DC
  Administered 2015-02-25 – 2015-02-26 (×2): 100 mg via ORAL
  Filled 2015-02-25 (×2): qty 1

## 2015-02-25 MED ORDER — METRONIDAZOLE IN NACL 5-0.79 MG/ML-% IV SOLN
500.0000 mg | Freq: Once | INTRAVENOUS | Status: AC
  Administered 2015-02-25: 500 mg via INTRAVENOUS

## 2015-02-25 MED ORDER — ATORVASTATIN CALCIUM 10 MG PO TABS
10.0000 mg | ORAL_TABLET | Freq: Every day | ORAL | Status: DC
  Administered 2015-02-25 – 2015-02-26 (×2): 10 mg via ORAL
  Filled 2015-02-25 (×2): qty 1

## 2015-02-25 MED ORDER — SODIUM CHLORIDE 0.9 % IV BOLUS (SEPSIS)
1000.0000 mL | Freq: Once | INTRAVENOUS | Status: AC
  Administered 2015-02-25: 1000 mL via INTRAVENOUS

## 2015-02-25 MED ORDER — METRONIDAZOLE IN NACL 5-0.79 MG/ML-% IV SOLN
500.0000 mg | Freq: Three times a day (TID) | INTRAVENOUS | Status: DC
  Administered 2015-02-25 – 2015-02-26 (×2): 500 mg via INTRAVENOUS
  Filled 2015-02-25 (×6): qty 100

## 2015-02-25 MED ORDER — LEVETIRACETAM 500 MG PO TABS
500.0000 mg | ORAL_TABLET | Freq: Two times a day (BID) | ORAL | Status: DC
  Administered 2015-02-25 – 2015-02-27 (×4): 500 mg via ORAL
  Filled 2015-02-25 (×4): qty 1

## 2015-02-25 MED ORDER — METRONIDAZOLE IN NACL 5-0.79 MG/ML-% IV SOLN
INTRAVENOUS | Status: AC
  Filled 2015-02-25: qty 100

## 2015-02-25 MED ORDER — QUETIAPINE FUMARATE 25 MG PO TABS
25.0000 mg | ORAL_TABLET | Freq: Every day | ORAL | Status: DC
  Administered 2015-02-25 – 2015-02-26 (×2): 25 mg via ORAL
  Filled 2015-02-25 (×2): qty 1

## 2015-02-25 MED ORDER — ONDANSETRON HCL 4 MG/2ML IJ SOLN: 4.0000 mg | Freq: Four times a day (QID) | INTRAMUSCULAR | Status: DC | PRN

## 2015-02-25 MED ORDER — CLONAZEPAM 0.5 MG PO TABS
0.5000 mg | ORAL_TABLET | Freq: Three times a day (TID) | ORAL | Status: DC
  Administered 2015-02-25 – 2015-02-27 (×5): 0.5 mg via ORAL
  Filled 2015-02-25 (×5): qty 1

## 2015-02-25 MED ORDER — VITAMIN B-12 1000 MCG PO TABS
1000.0000 ug | ORAL_TABLET | Freq: Every day | ORAL | Status: DC
  Administered 2015-02-26 – 2015-02-27 (×2): 1000 ug via ORAL
  Filled 2015-02-25 (×2): qty 1

## 2015-02-25 MED ORDER — FOLIC ACID 1 MG PO TABS
1.0000 mg | ORAL_TABLET | Freq: Every day | ORAL | Status: DC
  Administered 2015-02-25 – 2015-02-27 (×3): 1 mg via ORAL
  Filled 2015-02-25 (×3): qty 1

## 2015-02-25 MED ORDER — VITAMIN B-1 100 MG PO TABS
100.0000 mg | ORAL_TABLET | Freq: Every day | ORAL | Status: DC
  Administered 2015-02-26 – 2015-02-27 (×2): 100 mg via ORAL
  Filled 2015-02-25 (×2): qty 1

## 2015-02-25 MED ORDER — CLOPIDOGREL BISULFATE 75 MG PO TABS
75.0000 mg | ORAL_TABLET | Freq: Every day | ORAL | Status: DC
  Administered 2015-02-25 – 2015-02-27 (×3): 75 mg via ORAL
  Filled 2015-02-25 (×3): qty 1

## 2015-02-25 MED ORDER — IOHEXOL 240 MG/ML SOLN
25.0000 mL | INTRAMUSCULAR | Status: AC
  Administered 2015-02-25: 25 mL via ORAL

## 2015-02-25 MED ORDER — FENTANYL 25 MCG/HR TD PT72
25.0000 ug | MEDICATED_PATCH | TRANSDERMAL | Status: DC
  Administered 2015-02-25: 25 ug via TRANSDERMAL
  Filled 2015-02-25: qty 1

## 2015-02-25 MED ORDER — FAMOTIDINE 20 MG PO TABS
20.0000 mg | ORAL_TABLET | Freq: Two times a day (BID) | ORAL | Status: DC
  Administered 2015-02-25 – 2015-02-27 (×4): 20 mg via ORAL
  Filled 2015-02-25 (×4): qty 1

## 2015-02-25 MED ORDER — CIPROFLOXACIN IN D5W 400 MG/200ML IV SOLN
INTRAVENOUS | Status: AC
  Administered 2015-02-25: 400 mg via INTRAVENOUS
  Filled 2015-02-25: qty 200

## 2015-02-25 MED ORDER — ONDANSETRON HCL 4 MG PO TABS: 4.0000 mg | ORAL_TABLET | Freq: Four times a day (QID) | ORAL | Status: DC | PRN

## 2015-02-25 MED ORDER — DONEPEZIL HCL 5 MG PO TABS
5.0000 mg | ORAL_TABLET | Freq: Every day | ORAL | Status: DC
  Administered 2015-02-25 – 2015-02-26 (×2): 5 mg via ORAL
  Filled 2015-02-25 (×2): qty 1

## 2015-02-25 MED ORDER — SODIUM CHLORIDE 0.9 % IV SOLN
1000.0000 mL | Freq: Once | INTRAVENOUS | Status: AC
  Administered 2015-02-25: 1000 mL via INTRAVENOUS

## 2015-02-25 MED ORDER — CIPROFLOXACIN IN D5W 400 MG/200ML IV SOLN
400.0000 mg | Freq: Two times a day (BID) | INTRAVENOUS | Status: DC
  Administered 2015-02-25 – 2015-02-26 (×2): 400 mg via INTRAVENOUS
  Filled 2015-02-25 (×4): qty 200

## 2015-02-25 MED ORDER — IOHEXOL 300 MG/ML  SOLN
80.0000 mL | Freq: Once | INTRAMUSCULAR | Status: AC | PRN
  Administered 2015-02-25: 100 mL via INTRAVENOUS

## 2015-02-25 NOTE — Progress Notes (Signed)
Patient is confused and alert. Reporting no pain. Admitted to floor from ER this afternoon. BM x 1 since admission, positive for c-diff. IVF infusing. Up to BR with standby assist. Resting quietly between care, impulsive at times. Bed alarm active.

## 2015-02-25 NOTE — ED Provider Notes (Signed)
[facility] Emergency Department Provider Note  ____________________________________________  Time seen: Approximately 932 AM  I have reviewed the triage vital signs and the nursing notes.   HISTORY  Chief Complaint Weakness  Patient with mild confusion  HPI [name] is a 59 y.o. female who comes in to the hospital feeling weak. The patient reports that she has been going to the bathroom all of the time. She reports that she doesn't feel like doing anything, she doesn't feel like eating. The patient reports that she has been having chronic diarrhea for approximately 2 months. Per EMS the patient has been diagnosed and has been undergoing treatment for C. difficile but tonight did have some fever as well as tachycardia and low blood pressure. The patient has been having some abdominal pain and per the nurse at the nursing home vomited after being fed through her G-tube. The patient reports that she was here 2-3 weeks ago but she is not oriented to time. The patient reports that she has abdominal pain all day when she needs to go to the bathroom. She reports that she feels weak all over and has had some diarrhea with blood. The patient is unable to tell me her pain on a scale from 0-10.   Past Medical History  Diagnosis Date  . ETOH abuse   . CVA (cerebral infarction)   . Acute CHF (congestive heart failure)   . Stress-induced cardiomyopathy     Patient Active Problem List   Diagnosis Date Noted  . Cerebral infarction 12/07/2014  . Dysphagia   . Acute respiratory failure with hypoxia 11/29/2014  . Encounter for intubation 11/29/2014    No past surgical history on file.  No current outpatient prescriptions on file.  Allergies No known allergies  No family history on file.  Social History History  Substance Use Topics  . Smoking status: Former Research scientist (life sciences)  . Smokeless tobacco: Not on file  . Alcohol Use: Yes    Review of Systems Constitutional:  fever/chills Eyes: No visual changes. ENT: No sore throat. Cardiovascular: Denies chest pain. Respiratory: Denies shortness of breath. Gastrointestinal: abdominal pain.   nausea, vomiting.  diarrhea.   Genitourinary: Negative for dysuria. Musculoskeletal: Negative for back pain. Skin: Negative for rash. Neurological: Generalized weakness Hematological/Lymphatic:Lower extremity swelling  10-point ROS otherwise negative.  ____________________________________________   PHYSICAL EXAM:  VITAL SIGNS: ED Triage Vitals  Enc Vitals Group     BP 02/25/15 0558 84/67 mmHg     Pulse Rate 02/25/15 0558 112     Resp 02/25/15 0558 21     Temp --      Temp src --      SpO2 02/25/15 0551 96 %     Weight 02/25/15 0558 102 lb (46.267 kg)     Height 02/25/15 0558 5\' 2"  (1.575 m)     Head Cir --      [facility] --      Pain Score --      Pain Loc --      Pain Edu? --      Excl. in [place]? --     Constitutional: Alert and disoriented to time.  Eyes: Conjunctivae are normal. PERRL. EOMI. Head: Atraumatic. Nose: No congestion/rhinnorhea. Mouth/Throat: Mucous membranes.  Oropharynx non-erythematous. Cardiovascular: Tachycardia regular rhythm. Grossly normal heart sounds.   Respiratory: Normal respiratory effort.  No retractions. Lungs CTAB. Gastrointestinal: Soft and nontender. No distention. Positive bowel sounds Genitourinary: Deferred Musculoskeletal: Extremity edema Neurologic:  Normal speech and language.  No gross focal neurologic deficits are appreciated. Speech is normal.  Skin:  Skin is warm, dry and intact. No rash noted. Psychiatric: Mood and affect are normal. Speech and behavior are normal.  ____________________________________________   LABS (all labs ordered are listed, but only abnormal results are displayed)  Labs Reviewed  CBC - Abnormal; Notable for the following:    WBC 15.7 (*)    RBC 3.77 (*)    Hemoglobin 10.1 (*)    HCT 31.4 (*)    RDW 17.6 (*)    All other  components within normal limits  BASIC METABOLIC PANEL - Abnormal; Notable for the following:    Potassium 3.3 (*)    BUN 31 (*)    Creatinine, Ser 1.20 (*)    GFR calc non Af Amer 50 (*)    GFR calc Af Amer 57 (*)    All other components within normal limits  TROPONIN I  CBG MONITORING, ED   ____________________________________________  EKG  ED ECG REPORT   Date: 02/25/2015  EKG Time: 558  Rate: 112  Rhythm: sinus tachycardia  Axis: Normal  Intervals:none  ST&T Change: None  ____________________________________________  RADIOLOGY  Pending ____________________________________________   PROCEDURES  Procedure(s) performed: None  Critical Care performed: No  ____________________________________________   INITIAL IMPRESSION / ASSESSMENT AND PLAN / ED COURSE  Pertinent labs & imaging results that were available during my care of the patient were reviewed by me and considered in my medical decision making (see chart for details).  This is 59 year old female who comes in with a history of C. difficile who has been having vomiting and diarrhea and abdominal pain as well as tachycardia and low blood pressure tonight at her nursing home. We will check the patient's blood work as well as a CT scan of her abdomen and pelvis looking for possible bowel wall thickening versus toxic megacolon. The patient will receive some fluids as her blood pressure when she initially arrived was 84/67. Otherwise we will continue to evaluate the patient while she is in the emergency department.  The patient's care was signed out to Dr. Corky Downs who will follow-up the results of the CT scan and reevaluate the patient. ____________________________________________   FINAL CLINICAL IMPRESSION(S) / ED DIAGNOSES  Final diagnoses:  Abdominal pain  Vomiting  Hypotension       Loney Hering, MD 02/25/15 [phone]

## 2015-02-25 NOTE — ED Notes (Signed)
Per [facility], pt can tolerate medicine crushed with applesauce orally and has 240 ml bolus feedings 4x a day via peg tube. Paged MD and called oncoming nurse.

## 2015-02-25 NOTE — H&P (Signed)
[place] [facility] [place] NAME: [name]    MR#:  [id number]  DATE OF BIRTH:  Jan 23, 1956  DATE OF ADMISSION:  02/25/2015  PRIMARY CARE PHYSICIAN: No primary care provider on file.   REQUESTING/REFERRING PHYSICIAN: Dr.Kinner  CHIEF COMPLAINT:   Diarrhea, Weakness.  HISTORY OF PRESENT ILLNESS: [name]  is a 59 y.o. female with a known history of alcohol abuse hypertension and throat cancer and status post surgery and radiation for cervical cancer and stroke , January 2016 had seizures and has a gastric tube for feeding, who lives in a nursing facility and came to the emergency room today because of weakness and having loose watery stool for last almost a week. On further questioning she denies any fevers chills vomiting or nausea. In the ER CT scan of abdomen is done which showed colitis so given to hospitalist team for further management.  PAST MEDICAL HISTORY:   Past Medical History  Diagnosis Date  . ETOH abuse   . CVA (cerebral infarction)   . Acute CHF (congestive heart failure)   . Stress-induced cardiomyopathy   . Cancer   . Cervical cancer   . Throat cancer     PAST SURGICAL HISTORY: No past surgical history on file.  SOCIAL HISTORY:  History  Substance Use Topics  . Smoking status: Current Every Day Smoker -- 1.00 packs/day    Types: Cigarettes  . Smokeless tobacco: Not on file  . Alcohol Use: Yes    FAMILY HISTORY:  Family History  Problem Relation Age of Onset  . Diabetes Mother   . Lung cancer Father     DRUG ALLERGIES:  Allergies  Allergen Reactions  . No Known Allergies     REVIEW OF SYSTEMS:   CONSTITUTIONAL: No fever, positive for fatigue or weakness.  EYES: No blurred or double vision.  EARS, NOSE, AND THROAT: No tinnitus or ear pain.  RESPIRATORY: No cough, shortness of breath, wheezing or hemoptysis.  CARDIOVASCULAR: No chest pain, orthopnea, edema.  GASTROINTESTINAL: No nausea, vomiting,  positive for diarrhea, no abdominal pain.  GENITOURINARY: No dysuria, hematuria.  ENDOCRINE: No polyuria, nocturia,  HEMATOLOGY: No anemia, easy bruising or bleeding SKIN: No rash or lesion. MUSCULOSKELETAL: No joint pain or arthritis.   NEUROLOGIC: No tingling, numbness, weakness.  PSYCHIATRY: No anxiety or depression.   MEDICATIONS AT HOME:  Need to be reviewed by pharmacist.   PHYSICAL EXAMINATION:   VITAL SIGNS: Blood pressure 96/71, pulse 102, resp. rate 15, height 5\' 2"  (1.575 m), weight 46.267 kg (102 lb), SpO2 100 %.  GENERAL:  59 y.o.-year-old patient lying in the bed with no acute distress.  EYES: Pupils equal, round, reactive to light and accommodation. No scleral icterus. Extraocular muscles intact.  HEENT: Head atraumatic, normocephalic. Oropharynx and nasopharynx clear.  NECK:  Supple, no jugular venous distention. No thyroid enlargement, no tenderness.  LUNGS: Normal breath sounds bilaterally, no wheezing, rales,rhonchi or crepitation. No use of accessory muscles of respiration.  CARDIOVASCULAR: S1, S2 normal. No murmurs, rubs, or gallops.  ABDOMEN: Soft, nontender, nondistended. Bowel sounds present. No organomegaly or mass. PEG tube in place. EXTREMITIES: No pedal edema, cyanosis, or clubbing.  NEUROLOGIC: Cranial nerves II through XII are intact. Muscle strength 5/5 in all extremities. Sensation intact. Gait not checked.  PSYCHIATRIC: The patient is alert and oriented x 3.  SKIN: No obvious rash, lesion, or ulcer.   LABORATORY PANEL:   CBC  Recent Labs Lab 02/25/15 0619  WBC  15.7*  HGB 10.1*  HCT 31.4*  PLT 352  MCV 83.4  MCH 26.9  MCHC 32.3  RDW 17.6*   ------------------------------------------------------------------------------------------------------------------  Chemistries   Recent Labs Lab 02/25/15 0619  NA 143  K 3.3*  CL 103  CO2 29  GLUCOSE 99  BUN 31*  CREATININE 1.20*  CALCIUM 9.4    ------------------------------------------------------------------------------------------------------------------ estimated creatinine clearance is 38.3 mL/min (by C-G formula based on Cr of 1.2).   Cardiac Enzymes  Recent Labs Lab 02/25/15 0619  TROPONINI <0.03   Urinalysis    Component Value Date/Time   COLORURINE YELLOW 11/26/2014 0804   APPEARANCEUR CLEAR 11/26/2014 0804   LABSPEC 1.007 11/26/2014 0804   PHURINE 7.5 11/26/2014 0804   GLUCOSEU NEGATIVE 11/26/2014 0804   HGBUR NEGATIVE 11/26/2014 0804   BILIRUBINUR NEGATIVE 11/26/2014 0804   KETONESUR NEGATIVE 11/26/2014 0804   PROTEINUR NEGATIVE 11/26/2014 0804   UROBILINOGEN 0.2 11/26/2014 0804   NITRITE NEGATIVE 11/26/2014 0804   LEUKOCYTESUR NEGATIVE 11/26/2014 0804     RADIOLOGY: Ct Abdomen Pelvis W Contrast  02/25/2015   CLINICAL DATA:  Diarrhea. Chronic diarrhea for 2 months. Abdominal pain. C difficile infection.  EXAM: CT ABDOMEN AND PELVIS WITH CONTRAST  TECHNIQUE: Multidetector CT imaging of the abdomen and pelvis was performed using the standard protocol following bolus administration of intravenous contrast.  CONTRAST:  173mL OMNIPAQUE IOHEXOL 300 MG/ML  SOLN  COMPARISON:  CT 12/06/2014  FINDINGS: Lower chest: Lung bases are clear.  Hepatobiliary: No focal hepatic lesion. No biliary duct dilatation. The gallbladder wall is mildly thickened to 4 mm. This is felt to be secondary to patient motion and not a true finding. No gallbladder distension. No radiodense gallstones are present.  Pancreas: Pancreas is normal. No ductal dilatation. No pancreatic inflammation.  Spleen: Normal spleen  Adrenals/urinary tract: Adrenal glands are normal. There is motion artifact in the upper abdomen this explain the gallbladder wall thickening. As this is also seen at the double shadow on the kidney on image 19, series 2. There is no hydronephrosis. No ureterolithiasis. Normal bladder. Which  Stomach/Bowel: The stomach small bowel are  normal. No evidence of small bowel inflammation or obstruction. Contrast flows into the ascending colon. There is submucosal edema within the cecum seen on coronal image 44, series 5. This extends to the orifice of the appendix. The body and tail the appendix appear normal (image 57, series 5). Small amount of fluid inferior to the cecum seen on image 42, series 5. The transverse colon demonstrates thickening of the haustral folds. There is no pericolonic inflammation. There is some submucosal edema within the sigmoid colon and rectum. Contrast transits the entirety of the bowel to the rectum. There is no dilatation of the colon.  Vascular/Lymphatic: Abdominal aorta is normal caliber with atherosclerotic calcification. There is no retroperitoneal or periportal lymphadenopathy. No pelvic lymphadenopathy.  Reproductive: There is a calcification in right neck region which is felt to represent a calcified leiomyoma. The ovaries appear normal.  Musculoskeletal: No aggressive osseous lesion.  Other: No free fluid.  IMPRESSION: 1. Mild thickening of the haustral folds in the transverse colon and submucosal edema within the sigmoid colon and rectum suggests mild colitis. No colonic dilatation or obstruction. 2. Mild inflammation of the cecum with submucosal edema. This this edema involves the base appendix ; however the body and tail the appendix are normal. Findings do not favor appendicitis. 3. Atherosclerotic calcification of the aorta and branches. 4. Calcified fibroid uterus.   Electronically Signed   By:  Suzy Bouchard M.D.   On: 02/25/2015 11:41    IMPRESSION AND PLAN:  * Colitis   Will give Cipro+ flagyl IV, check for c diff.  *  Elevated WBCs   From above. Monitor.  * Hx of Stroke   Continue ASA, statin  * Hypertension  Cont metoprolol.  * Smoking   Councelled fro 4 min to quit- give Nicotin patch.   All the records are reviewed and case discussed with ED provider. Management plans discussed  with the patient, family and they are in agreement.  CODE STATUS:Full    TOTAL TIME TAKING CARE OF THIS PATIENT: 50 minutes.    Vaughan Basta M.D on 02/25/2015   Between 7am to 6pm - Pager - [phone]  After 6pm go to [email or web address] - password [email or web address] [facility]  [place] [facility]  Office  [phone]  CC: Primary care physician; No primary care provider on file.

## 2015-02-25 NOTE — ED Notes (Signed)
Pt finished contrast and with CT.

## 2015-02-26 LAB — BASIC METABOLIC PANEL
Anion gap: 11 (ref 5–15)
BUN: 16 mg/dL (ref 6–20)
CO2: 20 mmol/L — AB (ref 22–32)
CREATININE: 0.81 mg/dL (ref 0.44–1.00)
Calcium: 8.8 mg/dL — ABNORMAL LOW (ref 8.9–10.3)
Chloride: 112 mmol/L — ABNORMAL HIGH (ref 101–111)
GFR calc Af Amer: >60 mL/min (ref >60)
GFR calc non Af Amer: >60 mL/min (ref >60)
Glucose, Bld: 62 mg/dL — ABNORMAL LOW (ref 65–99)
Potassium: 3.2 mmol/L — ABNORMAL LOW (ref 3.5–5.1)
Sodium: 143 mmol/L (ref 135–145)

## 2015-02-26 LAB — CBC
HCT: 29.3 % — ABNORMAL LOW (ref 35.0–47.0)
HEMOGLOBIN: 9.2 g/dL — AB (ref 12.0–16.0)
MCH: 27 pg (ref 26.0–34.0)
MCHC: 31.4 g/dL — ABNORMAL LOW (ref 32.0–36.0)
MCV: 86 fL (ref 80.0–100.0)
Platelets: 304 10*3/uL (ref 150–440)
RBC: 3.4 MIL/uL — AB (ref 3.80–5.20)
RDW: 17.2 % — ABNORMAL HIGH (ref 11.5–14.5)
WBC: 7 10*3/uL (ref 3.6–11.0)

## 2015-02-26 MED ORDER — METRONIDAZOLE 500 MG PO TABS: 500.0000 mg | ORAL_TABLET | Freq: Three times a day (TID) | ORAL | Status: DC

## 2015-02-26 MED ORDER — ENSURE ENLIVE PO LIQD
237.0000 mL | Freq: Three times a day (TID) | ORAL | Status: DC
  Administered 2015-02-26 – 2015-02-27 (×3): 237 mL via ORAL

## 2015-02-26 MED ORDER — POTASSIUM CHLORIDE 20 MEQ PO PACK
40.0000 meq | PACK | Freq: Two times a day (BID) | ORAL | Status: AC
  Administered 2015-02-26 – 2015-02-27 (×3): 40 meq via ORAL
  Filled 2015-02-26 (×3): qty 2

## 2015-02-26 MED ORDER — RISAQUAD PO CAPS
2.0000 | ORAL_CAPSULE | Freq: Two times a day (BID) | ORAL | Status: DC
  Administered 2015-02-26 – 2015-02-27 (×2): 2 via ORAL
  Filled 2015-02-26 (×4): qty 2

## 2015-02-26 MED ORDER — MAGNESIUM OXIDE 400 (241.3 MG) MG PO TABS
400.0000 mg | ORAL_TABLET | Freq: Every day | ORAL | Status: DC
  Administered 2015-02-26 – 2015-02-27 (×2): 400 mg via ORAL
  Filled 2015-02-26 (×2): qty 1

## 2015-02-26 MED ORDER — ADULT MULTIVITAMIN W/MINERALS CH
1.0000 | ORAL_TABLET | Freq: Every day | ORAL | Status: DC
  Administered 2015-02-26 – 2015-02-27 (×2): 1 via ORAL
  Filled 2015-02-26 (×2): qty 1

## 2015-02-26 MED ORDER — VANCOMYCIN 50 MG/ML ORAL SOLUTION
250.0000 mg | Freq: Four times a day (QID) | ORAL | Status: DC
  Administered 2015-02-26 – 2015-02-27 (×5): 250 mg via ORAL
  Filled 2015-02-26 (×11): qty 5

## 2015-02-26 MED ORDER — PREDNISONE 5 MG PO TABS
5.0000 mg | ORAL_TABLET | Freq: Every day | ORAL | Status: DC
  Administered 2015-02-26 – 2015-02-27 (×2): 5 mg via ORAL
  Filled 2015-02-26 (×3): qty 1

## 2015-02-26 NOTE — Progress Notes (Signed)
RD Assessment  Admitted with: weakness PMHx:  Past Medical History  Diagnosis Date  . ETOH abuse   . CVA (cerebral infarction)   . Acute CHF (congestive heart failure)   . Stress-induced cardiomyopathy   . Cancer   . Cervical cancer   . Throat cancer   . Clostridium difficile colitis      Current Diet: Full Liquid Typical Food/ Fluid Intake: Just started on FL, no meals eaten recorded yet Meal/ Snack Patterns: Unable to assess  Supplements: None, on previous admission patient receiving Ensure TID with meals  Food Allergies: NKFA Food Preferences: Reviewed  Ht: 62" Current weight: 102 BMI: 18.7 Weight Changes: Unable to assess weight changes per patient. Reviewed old hospital records x 3 months (Feb 2016- 108#) and 6 months (Nov 2015- 109.9#)  UOP:   Intake/Output Summary (Last 24 hours) at 02/26/15 1459 Last data filed at 02/26/15 0942  Gross per 24 hour  Intake      0 ml  Output    175 ml  Net   -175 ml    Digestive: Last BM- 5/15 Gastrointestinal: Reviewed Skin: reviewed, no concerns Physical Findings: n/a  Labs: Electrolyte and Renal Profile:    Recent Labs Lab 02/25/15 0619 02/26/15 0603  BUN 31* 16  CREATININE 1.11*  1.20* 0.81  NA 143 143  K 3.3* 3.2*     Meds: Lipitor, Folic Acid, B-1  PES Statement: Inadequate oral intake related to acute illness as evidenced by previous NPO status.  Intervention:  Meals and Snacks: Cater to patient preferences Medical Nutrition Supplement: Ensure Enlive TID with meals  Monitoring/ Evaluation: Energy Intake: goal for patient to meet >90% of estimated needs, Labs, Supplement acceptance  Roda Shutters, RDN Pager: [phone] Office: [place]

## 2015-02-26 NOTE — Clinical Social Work Note (Signed)
Clinical Social Work Assessment  Patient Details  Name: Arista Kettlewell MRN: [id number] Date of Birth: 08-Nov-1955  Date of referral:  02/26/15               Reason for consult:  Facility Placement                Permission sought to share information with:  Chartered certified accountant granted to share information::  Yes, Verbal Permission Granted  Name::      Retail buyer::   [facility]  Relationship::   Nurse [place]:     Housing/Transportation Living arrangements for the past 2 months:  [place] of Information:  Patient Patient Interpreter Needed:  None Criminal Activity/Legal Involvement Pertinent to Current Situation/Hospitalization:  No - Comment as needed Significant Relationships:  Siblings Lives with:  Siblings Do you feel safe going back to the place where you live?  Yes Need for family participation in patient care:  Yes (Comment)  Care giving concerns: Patient is at [facility] for short term rehab.   Social Worker assessment / plan: Holiday representative (CSW) met with patient to discuss D/C plan. Patient had her eyes closed during assessment. Patient reported that she is at [facility] for [facility]. Patient reported that she lives in [place] with her sister. Patient stated that she did not want to go back to rehab and prefers to go home. CSW attempted to reach patient's family but the phone numbers in the chart are disconnected.  Per [facility] liasion patient is at [facility] for [facility] and has 34 days left. CSW made [facility] aware that patient is positive for C-Diff. [facility] reported that patient is in a Semi-Private room by herself now and can return there at D/C. FL2 completed. CSW will continue to follow and assist as needed.   Blima Rich, LCSWA [phone] Employment status:  Retired Forensic scientist:  Medicare PT Recommendations:  Not assessed at this  time Information / Referral to community resources:  [place]  Patient/Family's Response to care: Patient would like to go home. CSW is unable to reach family.   Emotional Assessment Appearance:  Appears stated age Attitude/Demeanor/Rapport:    Affect (typically observed):  Pleasant, Quiet, Restless Orientation:  Oriented to Self, Oriented to Place Alcohol / Substance use:  Not Applicable Psych involvement (Current and /or in the community):  No (Comment)  Discharge Needs  Concerns to be addressed:  Patient refuses services Readmission within the last 30 days:  No Current discharge risk:  Chronically ill Barriers to Discharge:  Continued Medical Work up   Elwyn Reach 02/26/2015, 4:35 PM

## 2015-02-26 NOTE — Progress Notes (Signed)
Patient alert but confused. Tolerating diet well. Up to bathroom one assist. Contact with enteric precautions maintained. Possible d/c tomorrow. Continue to monitor.

## 2015-02-26 NOTE — Evaluation (Signed)
Clinical/Bedside Swallow Evaluation Patient Details  Name: [name] MRN: [id number] Date of Birth: [birth year]  Today's Date: 02/26/2015 Time: SLP Start Time (ACUTE ONLY): 1400 SLP Stop Time (ACUTE ONLY): 1500 SLP Time Calculation (min) (ACUTE ONLY): 60 min  Past Medical History:  Past Medical History  Diagnosis Date  . ETOH abuse   . CVA (cerebral infarction)   . Acute CHF (congestive heart failure)   . Stress-induced cardiomyopathy   . Cancer   . Cervical cancer   . Throat cancer   . Clostridium difficile colitis    Past Surgical History: History reviewed. No pertinent past surgical history. HPI:  pt has been on a mech soft diet w/ thin liquids during a previous admission. During the most recent visit in Feb., pt was discharged to an LTAC; noted a PEG placement at that time as well. She was NPO d/t requring oral intubation.   Assessment / Plan / Recommendation Clinical Impression  Pt appeared to demo. min. overt s/s of aspiration w/ trials of thin liquids c/b delayed coughing noted x1 trial. No immeidate/delayed coughing noted w/ trials of Nectar liquids taken by pt. Pt consumed purees and 1 trial of mech soft/solid w/ no s/s of aspiration, however, noted min. increased oral phase time for mastication (suspect d/t lacking sufficient dentition for masticating). Pt cleared orally given time. Pt exhibited no decline in repsiratory status during po intake; she denied any trouble swallowing the trials she accpeted. Due to pt's h/o dysphagia including throat Ca. and medical illness, as well as reported dysphagia diet at SNF, rec. a Dys. 3 w/ Nectar liquids w/ aspiration precautions; meds in puree. ST will f/u w/ toleration of diet and possible objective assessment. NSG/MD consulted.    Aspiration Risk  Mild    Diet Recommendation Dysphagia 3 (Mech soft);Nectar   Medication Administration: Whole meds with puree Compensations: Small sips/bites;Slow rate    Other  Recommendations Oral  Care Recommendations: Oral care BID   Follow Up Recommendations   possible f/u w/ objective swallow study    Frequency and Duration  3-5x week  1 week   Pertinent Vitals/Pain denied    SLP Swallow Goals     Swallow Study Prior Functional Status       General Date of Onset: 02/25/15 Other Pertinent Information: pt has been on a mech soft diet w/ thin liquids during a previous admission. During the most recent visit in Feb., pt was discharged to an LTAC; noted a PEG placement at that time as well. She was NPO d/t requring oral intubation. Type of Study: Bedside swallow evaluation Previous Swallow Assessment: previous MBSS in 2009 - rec. a mech soft w/ thin liquids. Pt has been seen by ST services for initiation of an oral diet at SNF(suspected) as pt stated she eats a diet there but "they make the liquids too thick". Diet Prior to this Study: Dysphagia 3 (soft);Nectar-thick liquids (per pt report) Temperature Spikes Noted: No Respiratory Status: Room air History of Recent Intubation: Yes Behavior/Cognition: Alert;Cooperative;Confused (uncooperative at times) Oral Cavity - Dentition: Edentulous Self-Feeding Abilities: Able to feed self Patient Positioning: Upright in bed Baseline Vocal Quality: Normal;Low vocal intensity Volitional Cough: Strong Volitional Swallow: Able to elicit    Oral/Motor/Sensory Function Overall Oral Motor/Sensory Function: Appears within functional limits for tasks assessed (somewhat limited eval d/t pt's cooperation)   [employer] chips: Within functional limits Presentation: Spoon Other Comments:  (x5)   Thin Liquid Thin Liquid: Impaired Presentation: Cup Oral Phase Impairments:  (none  w/ liquids and purees; lengthier mastication time w/ solid sec. to lacking dentition- pt cleared given time) Pharyngeal  Phase Impairments: Cough - Delayed (x1/3 trials)    Nectar Thick Nectar Thick Liquid: Within functional limits Presentation:  (x3 tirals accepted)    Honey Thick Honey Thick Liquid: Not tested   Puree Puree: Within functional limits Presentation: Self Fed;Spoon (x2 trials accepted)   Solid   GO    Solid: Within functional limits Presentation: Self Fed (x1 trial accepted)       Talha Iser 02/26/2015,3:14 PM

## 2015-02-26 NOTE — Progress Notes (Signed)
Pt has been confused this shift. Remains impulsive but related to bathroom needs. Pt has some diarrhea with the c-diff and urinary frequency with the ivf infusing.  NPO at this time. Has peg tube that may be used for medications if needed.

## 2015-02-26 NOTE — Progress Notes (Signed)
[facility] NAME: [name]    MR#:  [id number]  DATE OF BIRTH:  [birth year]  SUBJECTIVE:  Came in from NH with watery diarrhea on and off for 3 weeks. 2 diarrheal stools since admission  REVIEW OF SYSTEMS:    Review of Systems  Constitutional: Negative for fever, chills and weight loss.  HENT: Negative for ear discharge, ear pain and nosebleeds.   Eyes: Negative for blurred vision, pain and discharge.  Respiratory: Negative for cough, sputum production, shortness of breath, wheezing and stridor.   Cardiovascular: Negative for chest pain, palpitations, orthopnea and PND.  Gastrointestinal: Positive for nausea and diarrhea. Negative for vomiting and abdominal pain.  Genitourinary: Negative for urgency and frequency.  Musculoskeletal: Negative for back pain and joint pain.  Neurological: Negative for sensory change, speech change, focal weakness and weakness.  Psychiatric/Behavioral: Negative for depression. The patient is not nervous/anxious.     DRUG ALLERGIES:   Allergies  Allergen Reactions  . Geodon [Ziprasidone Hcl] Other (See Comments)    Unknown reaction  . Haldol [Haloperidol] Other (See Comments)    Unknown reaction    VITALS:  Blood pressure 92/56, pulse 80, temperature 97.7 F (36.5 C), temperature source Oral, resp. rate 18, height 5\' 2"  (1.575 m), weight 46.267 kg (102 lb), SpO2 99 %.  PHYSICAL EXAMINATION:   Physical Exam  GENERAL:  59 y.o.-year-old patient lying in the bed with no acute distress.  EYES: Pupils equal, round, reactive to light and accommodation. No scleral icterus. Extraocular muscles intact.  HEENT: Head atraumatic, normocephalic. Oropharynx and nasopharynx clear.  NECK:  Supple, no jugular venous distention. No thyroid enlargement, no tenderness.  LUNGS: Normal breath sounds bilaterally, no wheezing, rales, rhonchi. No use of accessory muscles of respiration.  CARDIOVASCULAR:  S1, S2 normal. No murmurs, rubs, or gallops.  ABDOMEN: Soft, nontender, nondistended. Bowel sounds present. No organomegaly or mass.  EXTREMITIES: No cyanosis, clubbing or edema b/l.    NEUROLOGIC: Cranial nerves II through XII are intact. No focal Motor or sensory deficits b/l.  gen weakness + PSYCHIATRIC: The patient is alert and oriented x 3.  SKIN: No obvious rash, lesion, or ulcer.    LABORATORY PANEL:   CBC  Recent Labs Lab 02/26/15 0603  WBC 7.0  HGB 9.2*  HCT 29.3*  PLT 304   ------------------------------------------------------------------------------------------------------------------  Chemistries   Recent Labs Lab 02/26/15 0603  NA 143  K 3.2*  CL 112*  CO2 20*  GLUCOSE 62*  BUN 16  CREATININE 0.81  CALCIUM 8.8*   ------------------------------------------------------------------------------------------------------------------  Cardiac Enzymes  Recent Labs Lab 02/25/15 0619  TROPONINI <0.03   ------------------------------------------------------------------------------------------------------------------  RADIOLOGY:  Ct Abdomen Pelvis W Contrast  02/25/2015   CLINICAL DATA:  Diarrhea. Chronic diarrhea for 2 months. Abdominal pain. C difficile infection.  EXAM: CT ABDOMEN AND PELVIS WITH CONTRAST  TECHNIQUE: Multidetector CT imaging of the abdomen and pelvis was performed using the standard protocol following bolus administration of intravenous contrast.  CONTRAST:  15mL OMNIPAQUE IOHEXOL 300 MG/ML  SOLN  COMPARISON:  CT 12/06/2014  FINDINGS: Lower chest: Lung bases are clear.  Hepatobiliary: No focal hepatic lesion. No biliary duct dilatation. The gallbladder wall is mildly thickened to 4 mm. This is felt to be secondary to patient motion and not a true finding. No gallbladder distension. No radiodense gallstones are present.  Pancreas: Pancreas is normal. No ductal dilatation. No pancreatic inflammation.  Spleen: Normal spleen  Adrenals/urinary tract:  Adrenal glands  are normal. There is motion artifact in the upper abdomen this explain the gallbladder wall thickening. As this is also seen at the double shadow on the kidney on image 19, series 2. There is no hydronephrosis. No ureterolithiasis. Normal bladder. Which  Stomach/Bowel: The stomach small bowel are normal. No evidence of small bowel inflammation or obstruction. Contrast flows into the ascending colon. There is submucosal edema within the cecum seen on coronal image 44, series 5. This extends to the orifice of the appendix. The body and tail the appendix appear normal (image 57, series 5). Small amount of fluid inferior to the cecum seen on image 42, series 5. The transverse colon demonstrates thickening of the haustral folds. There is no pericolonic inflammation. There is some submucosal edema within the sigmoid colon and rectum. Contrast transits the entirety of the bowel to the rectum. There is no dilatation of the colon.  Vascular/Lymphatic: Abdominal aorta is normal caliber with atherosclerotic calcification. There is no retroperitoneal or periportal lymphadenopathy. No pelvic lymphadenopathy.  Reproductive: There is a calcification in right neck region which is felt to represent a calcified leiomyoma. The ovaries appear normal.  Musculoskeletal: No aggressive osseous lesion.  Other: No free fluid.  IMPRESSION: 1. Mild thickening of the haustral folds in the transverse colon and submucosal edema within the sigmoid colon and rectum suggests mild colitis. No colonic dilatation or obstruction. 2. Mild inflammation of the cecum with submucosal edema. This this edema involves the base appendix ; however the body and tail the appendix are normal. Findings do not favor appendicitis. 3. Atherosclerotic calcification of the aorta and branches. 4. Calcified fibroid uterus.   Electronically Signed   By: Suzy Bouchard M.D.   On: 02/25/2015 11:41     ASSESSMENT AND PLAN:  59 year old AAF well known to [place]  with h/o CVA,Chronic ETOH abuse, smoking,HTn comes in from SNF with 3 week h/o diarrhea   * Recurrent C Diff Colitis  -spoke with dter and pt has had 2 rouds of treatment with po flagyl.  -will give po vanc -add probiotic -FLD -f/u GI as out pt  * Elevated WBCs  From above. Monitor.  * Hx of Stroke  Continue ASA, statin -PT to see pt  * Hypertension Cont metoprolol.  * Smoking  Councelled fro 4 min to quit- give Nicotin patch.   All the records are reviewed and case discussed with Care Management/Social Workerr. Management plans discussed with the patient, family and they are in agreement.  CODE STATUS: FULL  DVT Prophylaxis: Heparin  TOTAL TIME TAKING CARE OF THIS PATIENT: 35 minutes.   POSSIBLE D/C IN 1-2 DAYS, DEPENDING ON CLINICAL CONDITION. Dter Heloise Purpura M.D on 02/26/2015 at 4:14 PM  Between 7am to 6pm - Pager - [phone]  After 6pm go to [email or web address] - password [email or web address] [facility]  [facility]  Office  [phone]  CC: Primary care physician; No primary care provider on file.

## 2015-02-26 NOTE — Plan of Care (Signed)
Problem: SLP Dysphagia Goals Goal: Misc Dysphagia Goal Pt will safely tolerate po diet of least restrictive consistency w/ no overt s/s of aspiration noted by Staff/pt/family x3 sessions.    

## 2015-02-27 ENCOUNTER — Inpatient Hospital Stay: Payer: Medicare Other

## 2015-02-27 MED ORDER — VANCOMYCIN 50 MG/ML ORAL SOLUTION: 250.0000 mg | Freq: Four times a day (QID) | ORAL | Status: DC

## 2015-02-27 MED ORDER — RISAQUAD PO CAPS: 2.0000 | ORAL_CAPSULE | Freq: Two times a day (BID) | ORAL | Status: AC

## 2015-02-27 MED ORDER — FUROSEMIDE 40 MG PO TABS: 20.0000 mg | ORAL_TABLET | Freq: Every day | ORAL | Status: AC

## 2015-02-27 MED ORDER — ENSURE ENLIVE PO LIQD: 237.0000 mL | Freq: Three times a day (TID) | ORAL | Status: DC

## 2015-02-27 NOTE — Progress Notes (Signed)
Patient is medically stable to D/C back to [facility]. Per [facility] liasion patient is going to room 166 a semi-private room however nobody is in there. CSW made [facility] aware that patient is positive for C-Diff. RN will call report to Yaakov Guthrie at [phone] and arrange EMS for transport. Clinical Education officer, museum (CSW) prepared D/C packet and sent D/C Summary and follow up appointment to [facility] via [facility]. CSW contacted patient's niece [name] and made her aware of above. Please reconsult if future social work needs arise. CSW signing off.   Blima Rich, [place] [phone]

## 2015-02-27 NOTE — Discharge Summary (Signed)
[name] NAME: [name]    MR#:  [id number]  DATE OF BIRTH:  09/16/1956  DATE OF ADMISSION:  02/25/2015 ADMITTING PHYSICIAN: Vaughan Basta, MD  DATE OF DISCHARGE: 02/27/2015  PRIMARY CARE PHYSICIAN: No primary care provider on file.    ADMISSION DIAGNOSIS:  Other specified hypotension [I95.89] Colitis [K52.9]  DISCHARGE DIAGNOSIS:  Recurrent C difficile colitis  SECONDARY DIAGNOSIS:   Past Medical History  Diagnosis Date  . ETOH abuse   . CVA (cerebral infarction)   . Acute CHF (congestive heart failure)   . Stress-induced cardiomyopathy   . Cancer   . Cervical cancer   . Throat cancer   . Clostridium difficile colitis     HOSPITAL COURSE:   59 year old AAF well known to [place] with h/o CVA,Chronic ETOH abuse, smoking,HTn comes in from SNF with 3 week h/o diarrhea   * Recurrent C Diff Colitis  -spoke with dter and pt has had 2 rounds of treatment with po flagyl.  - po vanc for total 14 days. Added probiotics -FLD -f/u GI as out pt. Will get appt with Dr Gustavo Lah  * Elevated WBCs  stable , no fevr  * Hx of Stroke  Continue ASA, statin -PT recomends rehab. [facility] at [facility]  * Hypertension bp stable. Not on any meds at present  * Dysphaiga -seen by speech therapy. Dietary instruction to be carried at [facility] -Swallow evaluation today  Overall improving. Will d/c to SNF today   DISCHARGE CONDITIONS:   fair  CONSULTS OBTAINED:   Speech therapy PT  DRUG ALLERGIES:   Allergies  Allergen Reactions  . Geodon [Ziprasidone Hcl] Other (See Comments)    Unknown reaction  . Haldol [Haloperidol] Other (See Comments)    Unknown reaction    DISCHARGE MEDICATIONS:   Current Discharge Medication List    START taking these medications   Details  acidophilus (RISAQUAD) CAPS capsule Take 2 capsules by mouth 2 (two) times daily with a meal. Qty: 40 capsule, Refills: 0     feeding supplement, ENSURE ENLIVE, (ENSURE ENLIVE) LIQD Take 237 mLs by mouth 3 (three) times daily with meals. Qty: 237 mL, Refills: 12    vancomycin (VANCOCIN) 50 mg/mL oral solution Take 5 mLs (250 mg total) by mouth every 6 (six) hours. Qty: 240 mL, Refills: 0      CONTINUE these medications which have CHANGED   Details  furosemide (LASIX) 40 MG tablet Take 0.5 tablets (20 mg total) by mouth daily. Qty: 30 tablet, Refills: 0      CONTINUE these medications which have NOT CHANGED   Details  atorvastatin (LIPITOR) 10 MG tablet Take 10 mg by mouth at bedtime.    clonazePAM (KLONOPIN) 0.5 MG tablet Take 0.5 mg by mouth 3 (three) times daily.    clopidogrel (PLAVIX) 75 MG tablet Take 75 mg by mouth daily.    donepezil (ARICEPT) 5 MG tablet Take 5 mg by mouth at bedtime.    famotidine (PEPCID) 20 MG tablet Take 20 mg by mouth 2 (two) times daily.    fentaNYL (DURAGESIC - DOSED MCG/HR) 25 MCG/HR patch Place 25 mcg onto the skin every 3 (three) days.    folic acid (FOLVITE) 564 MCG tablet Take 800 mcg by mouth daily.    levETIRAcetam (KEPPRA) 500 MG tablet Take 500 mg by mouth 2 (two) times daily.    magnesium oxide (MAG-OX) 400 MG tablet Take 400 mg by  mouth daily.    Multiple Vitamin (MULTIVITAMIN) tablet Take 1 tablet by mouth daily.    predniSONE (DELTASONE) 5 MG tablet Take 5 mg by mouth daily.    QUEtiapine (SEROQUEL) 25 MG tablet Take 25 mg by mouth at bedtime.    sertraline (ZOLOFT) 100 MG tablet Take 100 mg by mouth at bedtime.    thiamine (VITAMIN B-1) 100 MG tablet Take 100 mg by mouth daily.    vitamin B-12 (CYANOCOBALAMIN) 1000 MCG tablet Take 1,000 mcg by mouth daily.         DISCHARGE INSTRUCTIONS:    As above  If you experience worsening of your admission symptoms, develop shortness of breath, life threatening emergency, suicidal or homicidal thoughts you must seek medical attention immediately by calling 911 or calling your MD immediately  if  symptoms less severe.  You Must read complete instructions/literature along with all the possible adverse reactions/side effects for all the Medicines you take and that have been prescribed to you. Take any new Medicines after you have completely understood and accept all the possible adverse reactions/side effects.   Please note  You were cared for by a hospitalist during your hospital stay. If you have any questions about your discharge medications or the care you received while you were in the hospital after you are discharged, you can call the unit and asked to speak with the hospitalist on call if the hospitalist that took care of you is not available. Once you are discharged, your primary care physician will handle any further medical issues. Please note that NO REFILLS for any discharge medications will be authorized once you are discharged, as it is imperative that you return to your primary care physician (or establish a relationship with a primary care physician if you do not have one) for your aftercare needs so that they can reassess your need for medications and monitor your lab values.    Today   SUBJECTIVE   Doing well. Bowel frequency decreased. Tolerating po diet   VITAL SIGNS:  Blood pressure 134/73, pulse 76, temperature 97.3 F (36.3 C), temperature source Oral, resp. rate 18, height 5\' 2"  (1.575 m), weight 46.267 kg (102 lb), SpO2 100 %.  I/O:   Intake/Output Summary (Last 24 hours) at 02/27/15 1032 Last data filed at 02/26/15 1708  Gross per 24 hour  Intake      0 ml  Output     75 ml  Net    -75 ml    PHYSICAL EXAMINATION:   GENERAL: 59 y.o.-year-old patient lying in the bed with no acute distress.  EYES: Pupils equal, round, reactive to light and accommodation. No scleral icterus. Extraocular muscles intact.  HEENT: Head atraumatic, normocephalic. Oropharynx and nasopharynx clear.  NECK: Supple, no jugular venous distention. No thyroid enlargement, no  tenderness.  LUNGS: Normal breath sounds bilaterally, no wheezing, rales, rhonchi. No use of accessory muscles of respiration.  CARDIOVASCULAR: S1, S2 normal. No murmurs, rubs, or gallops.  ABDOMEN: Soft, nontender, nondistended. Bowel sounds present. No organomegaly or mass. PEG site looks ok EXTREMITIES: No cyanosis, clubbing or edema b/l.  NEUROLOGIC: Cranial nerves II through XII are intact. No focal Motor or sensory deficits b/l. gen weakness + PSYCHIATRIC: The patient is alert and oriented x 3.  SKIN: No obvious rash, lesion, or ulcer.  DATA REVIEW:   CBC  Recent Labs Lab 02/26/15 0603  WBC 7.0  HGB 9.2*  HCT 29.3*  PLT 304    Chemistries   Recent  Labs Lab 02/26/15 0603  NA 143  K 3.2*  CL 112*  CO2 20*  GLUCOSE 62*  BUN 16  CREATININE 0.81  CALCIUM 8.8*    Cardiac Enzymes  Recent Labs Lab 02/25/15 0619  TROPONINI <0.03    Microbiology Results   Recent Results (from the past 240 hour(s))  C difficile quick scan w PCR reflex [facility])     Status: None   Collection Time: 02/25/15  2:48 PM  Result Value Ref Range Status   C Diff antigen POSITIVE  Final   C Diff toxin NEGATIVE  Final   C Diff interpretation   Final    Positive for toxigenic C. difficile, active toxin production not detected. Patient has toxigenic C. difficile organisms present in the bowel, but toxin was not detected. The patient may be a carrier or the level of toxin in the sample was below the limit  of detection. This information should be used in conjunction with the patient's clinical history when deciding on possible therapy.     Comment: CRITICAL RESULT CALLED TO, READ BACK BY AND VERIFIED WITH: CALLED TO ERIN MAYNOR ON 02/25/15 AT 1645 BY JEF   Clostridium Difficile by PCR     Status: Abnormal   Collection Time: 02/25/15  2:48 PM  Result Value Ref Range Status   C difficile by pcr POSITIVE (A) NEGATIVE Final    RADIOLOGY:  Ct Abdomen Pelvis W Contrast  02/25/2015    CLINICAL DATA:  Diarrhea. Chronic diarrhea for 2 months. Abdominal pain. C difficile infection.  EXAM: CT ABDOMEN AND PELVIS WITH CONTRAST  TECHNIQUE: Multidetector CT imaging of the abdomen and pelvis was performed using the standard protocol following bolus administration of intravenous contrast.  CONTRAST:  121mL OMNIPAQUE IOHEXOL 300 MG/ML  SOLN  COMPARISON:  CT 12/06/2014  FINDINGS: Lower chest: Lung bases are clear.  Hepatobiliary: No focal hepatic lesion. No biliary duct dilatation. The gallbladder wall is mildly thickened to 4 mm. This is felt to be secondary to patient motion and not a true finding. No gallbladder distension. No radiodense gallstones are present.  Pancreas: Pancreas is normal. No ductal dilatation. No pancreatic inflammation.  Spleen: Normal spleen  Adrenals/urinary tract: Adrenal glands are normal. There is motion artifact in the upper abdomen this explain the gallbladder wall thickening. As this is also seen at the double shadow on the kidney on image 19, series 2. There is no hydronephrosis. No ureterolithiasis. Normal bladder. Which  Stomach/Bowel: The stomach small bowel are normal. No evidence of small bowel inflammation or obstruction. Contrast flows into the ascending colon. There is submucosal edema within the cecum seen on coronal image 44, series 5. This extends to the orifice of the appendix. The body and tail the appendix appear normal (image 57, series 5). Small amount of fluid inferior to the cecum seen on image 42, series 5. The transverse colon demonstrates thickening of the haustral folds. There is no pericolonic inflammation. There is some submucosal edema within the sigmoid colon and rectum. Contrast transits the entirety of the bowel to the rectum. There is no dilatation of the colon.  Vascular/Lymphatic: Abdominal aorta is normal caliber with atherosclerotic calcification. There is no retroperitoneal or periportal lymphadenopathy. No pelvic lymphadenopathy.   Reproductive: There is a calcification in right neck region which is felt to represent a calcified leiomyoma. The ovaries appear normal.  Musculoskeletal: No aggressive osseous lesion.  Other: No free fluid.  IMPRESSION: 1. Mild thickening of the haustral folds in the transverse colon  and submucosal edema within the sigmoid colon and rectum suggests mild colitis. No colonic dilatation or obstruction. 2. Mild inflammation of the cecum with submucosal edema. This this edema involves the base appendix ; however the body and tail the appendix are normal. Findings do not favor appendicitis. 3. Atherosclerotic calcification of the aorta and branches. 4. Calcified fibroid uterus.   Electronically Signed   By: Suzy Bouchard M.D.   On: 02/25/2015 11:41    Management plans discussed with the patient, family (dter) and they are in agreement.  CODE STATUS:     Code Status Orders        Start     Ordered   02/25/15 2140  Full code   Continuous     02/25/15 2139      TOTAL TIME TAKING CARE OF THIS PATIENT: 40 minutes.    Kaeson Kleinert M.D on 02/27/2015 at 10:32 AM  Between 7am to 6pm - Pager - [phone] After 6pm go to [email or web address] - password [email or web address]  [place] [facility]  Office  [phone]  CC: Primary care physician; No primary care provider on file.

## 2015-02-27 NOTE — Progress Notes (Signed)
Mrs Sammarco remains confused, impulsive to get up unassisted going to bathroom, "kitchen," "living room." Remains on Enteric precautions, receiving PO ABX with no adverse effects noted, afebrile. Possible d/c today. Nursing continues to monitor and assist with ADLs.

## 2015-02-27 NOTE — Discharge Instructions (Signed)
Dysphagia 3 (Mech soft);Nectar  Medication Administration: Whole meds with puree Compensations: Small sips/bites;Slow rate   PT at [facility]

## 2015-02-27 NOTE — Progress Notes (Signed)
Pt. For discharge to [facility] room 166.Report called to [facility] at [facility]. Pt vitals stable. Pt had one small smear of stool for this shift. EMS notified for transportation.

## 2015-02-27 NOTE — Procedures (Signed)
Objective Swallowing Evaluation:  (MBSS)  Patient Details  Name: [name] MRN: [id number] Date of Birth: Feb 21, 1956  Today's Date: 02/27/2015 Time: SLP Start Time (ACUTE ONLY): 1100-SLP Stop Time (ACUTE ONLY): 1200 SLP Time Calculation (min) (ACUTE ONLY): 60 min  Past Medical History:  Past Medical History  Diagnosis Date  . ETOH abuse   . CVA (cerebral infarction)   . Acute CHF (congestive heart failure)   . Stress-induced cardiomyopathy   . Cancer   . Cervical cancer   . Throat cancer   . Clostridium difficile colitis    Past Surgical History: History reviewed. No pertinent past surgical history. HPI:  Other Pertinent Information: pt has been on a mech soft diet w/ thin liquids during a previous admission. During the most recent visit in Feb., pt was discharged to an LTAC; noted a PEG placement at that time as well. She was NPO d/t requring oral intubation. Pt is currently on a Dys. 2 w/ Nectar liquids w/ aspiration precautions post BSE yesterday.   No Data Recorded  Assessment / Plan / Recommendation CHL IP CLINICAL IMPRESSIONS 02/27/2015  Therapy Diagnosis Moderate pharyngeal phase dysphagia;Moderate oral phase dysphagia;Moderate cervical esophageal phase dysphagia;Severe cervical esophageal phase dysphagia  Clinical Impression Pt presented w/ oral phase deficits c/b decreased bolus control moreso of liquids w/ premature spillage into the pharynx prior to the swallow. Pt presented w/ moderate+ delay in pharyngeal swallow initiation w/ liquids and purees, moreso thin liquids. This resulted in laryngeal penetration w/ aspiration before and during the swallow w/ thin liquid trials x2/5. Using small, single sips appeared most efficient in reducing risk for laryngeal penetration/aspiration. She also demo. decreased laryngeal elevation/excursion and pharyngeal pressure resulting in min.+ pharyngeal residue w/ all consistencies assessed. Pt was able to reduce/clear this pharyngeal residue  w/ a f/u swallow. Suspect min. amount of pyriform sinus residue could be related to the increased Esophageal pressure noted from the moderate-severe dysmotility noted during a Esophageal scan/screen of the upper-mid. Esophagus. Pt presented w/ continued Esophageal dysmotility throughout the entire exam w/ only a slight amount of clearing of material given moderate amount of time. This degree of retention of bolus material in the Esophagus w/ poor motility greatly increases pt's risk for aspiration of Reflux material.       CHL IP TREATMENT RECOMMENDATION 02/27/2015  Treatment Recommendations Therapy as outlined in treatment plan below     CHL IP DIET RECOMMENDATION 02/27/2015  SLP Diet Recommendations Dysphagia 2 (Fine chop);Nectar  Liquid Administration via (None)  Medication Administration Crushed with puree  Compensations Slow rate;Small sips/bites;Multiple dry swallows after each bite/sip  Postural Changes and/or Swallow Maneuvers (None)     CHL IP OTHER RECOMMENDATIONS 02/27/2015  Recommended Consults Consider GI evaluation  Oral Care Recommendations Oral care BID  Other Recommendations (None)     No flowsheet data found.   CHL IP FREQUENCY AND DURATION 02/27/2015  Speech Therapy Frequency (ACUTE ONLY) min 3x week  Treatment Duration 1 week     Pertinent Vitals/Pain denied    SLP Swallow Goals See care plan      CHL IP REASON FOR REFERRAL 02/27/2015  Reason for Referral Objectively evaluate swallowing function     CHL IP ORAL PHASE 02/27/2015  Lips (None)  Tongue (None)  Mucous membranes (None)  Nutritional status (None)  Other (None)  Oxygen therapy (None)  Oral Phase Impaired  Oral - Pudding Teaspoon (None)  Oral - Pudding Cup (None)  Oral - Honey Teaspoon (None)  Oral - Honey Cup (  None)  Oral - Honey Syringe (None)  Oral - Nectar Teaspoon (None)  Oral - Nectar Cup (None)  Oral - Nectar Straw (None)  Oral - Nectar Syringe (None)  Oral - Ice Chips (None)  Oral -  Thin Teaspoon (None)  Oral - Thin Cup (None)  Oral - Thin Straw (None)  Oral - Thin Syringe (None)  Oral - Puree (None)  Oral - Mechanical Soft (None)  Oral - Regular (None)  Oral - Multi-consistency (None)  Oral - Pill (None)  Oral Phase - Comment (None)      CHL IP PHARYNGEAL PHASE 02/27/2015  Pharyngeal Phase Impaired  Pharyngeal - Pudding Teaspoon (None)  Penetration/Aspiration details (pudding teaspoon) (None)  Pharyngeal - Pudding Cup (None)  Penetration/Aspiration details (pudding cup) (None)  Pharyngeal - Honey Teaspoon (None)  Penetration/Aspiration details (honey teaspoon) (None)  Pharyngeal - Honey Cup (None)  Penetration/Aspiration details (honey cup) (None)  Pharyngeal - Honey Syringe (None)  Penetration/Aspiration details (honey syringe) (None)  Pharyngeal - Nectar Teaspoon (None)  Penetration/Aspiration details (nectar teaspoon) (None)  Pharyngeal - Nectar Cup (None)  Penetration/Aspiration details (nectar cup) (None)  Pharyngeal - Nectar Straw (None)  Penetration/Aspiration details (nectar straw) (None)  Pharyngeal - Nectar Syringe (None)  Penetration/Aspiration details (nectar syringe) (None)  Pharyngeal - Ice Chips (None)  Penetration/Aspiration details (ice chips) (None)  Pharyngeal - Thin Teaspoon (None)  Penetration/Aspiration details (thin teaspoon) (None)  Pharyngeal - Thin Cup (None)  Penetration/Aspiration details (thin cup) (None)  Pharyngeal - Thin Straw (None)  Penetration/Aspiration details (thin straw) (None)  Pharyngeal - Thin Syringe (None)  Penetration/Aspiration details (thin syringe') (None)  Pharyngeal - Puree (None)  Penetration/Aspiration details (puree) (None)  Pharyngeal - Mechanical Soft (None)  Penetration/Aspiration details (mechanical soft) (None)  Pharyngeal - Regular (None)  Penetration/Aspiration details (regular) (None)  Pharyngeal - Multi-consistency (None)  Penetration/Aspiration details (multi-consistency) (None)   Pharyngeal - Pill (None)  Penetration/Aspiration details (pill) (None)  Pharyngeal Comment pt presented w/ moderate+ delay in pharyngeal swallow initiation w/ liquids and purees, moreso thin liquids. This resulted in laryngeal penetration w/ aspiration before and during the swallow w/ thin liquid trials x2/5. Using small, single sips appeared most efficient in reducing risk for laryngeal penetration/aspiration. She also demo. decreased laryngeal elevation/excursion and pharyngeal pressure resulting in min.+ pharyngeal residue w/ all consistencies assessed. Pt was able to reduce/clear this pharyngeal residue w/ a f/u swallow. Suspect min. amount of pyriform sinus residue could be related to the increased Esophageal pressure noted from the moderate-severe dysmotility noted during a Esophageal scan/screen of the upper-mid. Esophagus.      CHL IP CERVICAL ESOPHAGEAL PHASE 02/27/2015  Cervical Esophageal Phase Impaired  Pudding Teaspoon (None)  Pudding Cup (None)  Honey Teaspoon (None)  Honey Cup (None)  Honey Straw (None)  Nectar Teaspoon (None)  Nectar Cup Esophageal backflow into cervical esophagus  Nectar Straw (None)  Nectar Sippy Cup (None)  Thin Teaspoon (None)  Thin Cup (None)  Thin Straw (None)  Thin Sippy Cup (None)  Cervical Esophageal Comment (None)    No flowsheet data found.         Evamaria Detore 02/27/2015, 3:02 PM

## 2015-03-24 ENCOUNTER — Other Ambulatory Visit
Admission: AD | Admit: 2015-03-24 | Discharge: 2015-03-24 | Disposition: A | Discharge status: Home / Self Care | Payer: Medicare Other | Source: Ambulatory Visit | Attending: Emergency Medicine | Admitting: Emergency Medicine

## 2015-03-24 DIAGNOSIS — A047 Enterocolitis due to Clostridium difficile: Secondary | ICD-10-CM | POA: Insufficient documentation

## 2015-03-24 LAB — C DIFFICILE QUICK SCREEN W PCR REFLEX
C Diff antigen: POSITIVE
C Diff interpretation: POSITIVE
C Diff toxin: POSITIVE

## 2015-03-29 ENCOUNTER — Other Ambulatory Visit
Admission: RE | Admit: 2015-03-29 | Discharge: 2015-03-29 | Disposition: A | Discharge status: Home / Self Care | Payer: Medicare Other | Source: Other Acute Inpatient Hospital | Attending: Family Medicine | Admitting: Family Medicine

## 2015-03-29 DIAGNOSIS — R197 Diarrhea, unspecified: Secondary | ICD-10-CM | POA: Insufficient documentation

## 2015-03-29 LAB — C DIFFICILE QUICK SCREEN W PCR REFLEX
C Diff antigen: POSITIVE
C Diff interpretation: POSITIVE
C Diff toxin: POSITIVE

## 2015-04-01 LAB — STOOL CULTURE

## 2015-04-02 LAB — GIARDIA, EIA; OVA/PARASITE: Giardia Ag, Stl: NEGATIVE

## 2015-04-02 LAB — O&P RESULT

## 2015-04-20 ENCOUNTER — Encounter: Payer: Self-pay | Admitting: *Deleted

## 2015-04-23 ENCOUNTER — Ambulatory Visit: Payer: Medicare Other | Admitting: Anesthesiology

## 2015-04-23 ENCOUNTER — Encounter
Admission: RE | Disposition: A | Discharge status: Home / Self Care | Payer: Self-pay | Source: Ambulatory Visit | Attending: Unknown Physician Specialty

## 2015-04-23 ENCOUNTER — Ambulatory Visit
Admission: RE | Admit: 2015-04-23 | Discharge: 2015-04-23 | Disposition: A | Discharge status: Home / Self Care | Payer: Medicare Other | Source: Ambulatory Visit | Attending: Unknown Physician Specialty | Admitting: Unknown Physician Specialty

## 2015-04-23 DIAGNOSIS — K648 Other hemorrhoids: Secondary | ICD-10-CM | POA: Insufficient documentation

## 2015-04-23 DIAGNOSIS — J449 Chronic obstructive pulmonary disease, unspecified: Secondary | ICD-10-CM | POA: Insufficient documentation

## 2015-04-23 DIAGNOSIS — Z792 Long term (current) use of antibiotics: Secondary | ICD-10-CM | POA: Insufficient documentation

## 2015-04-23 DIAGNOSIS — I509 Heart failure, unspecified: Secondary | ICD-10-CM | POA: Insufficient documentation

## 2015-04-23 DIAGNOSIS — R569 Unspecified convulsions: Secondary | ICD-10-CM | POA: Diagnosis not present

## 2015-04-23 DIAGNOSIS — Z7952 Long term (current) use of systemic steroids: Secondary | ICD-10-CM | POA: Diagnosis not present

## 2015-04-23 DIAGNOSIS — Z951 Presence of aortocoronary bypass graft: Secondary | ICD-10-CM | POA: Insufficient documentation

## 2015-04-23 DIAGNOSIS — E785 Hyperlipidemia, unspecified: Secondary | ICD-10-CM | POA: Insufficient documentation

## 2015-04-23 DIAGNOSIS — A047 Enterocolitis due to Clostridium difficile: Secondary | ICD-10-CM | POA: Diagnosis present

## 2015-04-23 DIAGNOSIS — I2510 Atherosclerotic heart disease of native coronary artery without angina pectoris: Secondary | ICD-10-CM | POA: Diagnosis not present

## 2015-04-23 DIAGNOSIS — F17210 Nicotine dependence, cigarettes, uncomplicated: Secondary | ICD-10-CM | POA: Insufficient documentation

## 2015-04-23 DIAGNOSIS — Z79891 Long term (current) use of opiate analgesic: Secondary | ICD-10-CM | POA: Insufficient documentation

## 2015-04-23 DIAGNOSIS — Z7902 Long term (current) use of antithrombotics/antiplatelets: Secondary | ICD-10-CM | POA: Insufficient documentation

## 2015-04-23 DIAGNOSIS — I5181 Takotsubo syndrome: Secondary | ICD-10-CM | POA: Diagnosis not present

## 2015-04-23 DIAGNOSIS — Z79899 Other long term (current) drug therapy: Secondary | ICD-10-CM | POA: Insufficient documentation

## 2015-04-23 DIAGNOSIS — Z8673 Personal history of transient ischemic attack (TIA), and cerebral infarction without residual deficits: Secondary | ICD-10-CM | POA: Diagnosis not present

## 2015-04-23 HISTORY — DX: Hyperlipidemia, unspecified: E78.5

## 2015-04-23 HISTORY — PX: FECAL TRANSPLANT: SHX6383

## 2015-04-23 HISTORY — PX: COLONOSCOPY WITH PROPOFOL: SHX5780

## 2015-04-23 HISTORY — DX: Unspecified convulsions: R56.9

## 2015-04-23 SURGERY — COLONOSCOPY WITH PROPOFOL
Anesthesia: General

## 2015-04-23 MED ORDER — FENTANYL CITRATE (PF) 100 MCG/2ML IJ SOLN
INTRAMUSCULAR | Status: DC | PRN
  Administered 2015-04-23: 50 ug via INTRAVENOUS

## 2015-04-23 MED ORDER — MIDAZOLAM HCL 2 MG/2ML IJ SOLN
INTRAMUSCULAR | Status: DC | PRN
  Administered 2015-04-23: 1 mg via INTRAVENOUS

## 2015-04-23 MED ORDER — PROPOFOL INFUSION 10 MG/ML OPTIME
INTRAVENOUS | Status: DC | PRN
  Administered 2015-04-23: 100 ug/kg/min via INTRAVENOUS

## 2015-04-23 MED ORDER — EPHEDRINE SULFATE 50 MG/ML IJ SOLN
INTRAMUSCULAR | Status: DC | PRN
  Administered 2015-04-23: 5 mg via INTRAVENOUS

## 2015-04-23 MED ORDER — SODIUM CHLORIDE 0.9 % IV SOLN
INTRAVENOUS | Status: DC
  Administered 2015-04-23: 1000 mL via INTRAVENOUS

## 2015-04-23 NOTE — Transfer of Care (Signed)
Immediate Anesthesia Transfer of Care Note  Patient: [name]  Procedure(s) Performed: Procedure(s): COLONOSCOPY WITH PROPOFOL (N/A) FECAL TRANSPLANT (N/A)  Patient Location: PACU  Anesthesia Type:General  Level of Consciousness: awake and sedated  Airway & Oxygen Therapy: Patient Spontanous Breathing and Patient connected to nasal cannula oxygen  Post-op Assessment: Report given to RN and Post -op Vital signs reviewed and stable  Post vital signs: Reviewed and stable  Last Vitals:  Filed Vitals:   04/23/15 1033  BP: 120/78  Pulse: 71  Temp: 36.3 C  Resp: 17    Complications: No apparent anesthesia complications

## 2015-04-23 NOTE — Op Note (Signed)
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 Procedure:         Pre-Anesthesia Assessment:                    - After reviewing the risks and benefits, the patient was                     deemed in satisfactory condition to undergo the procedure.                    After obtaining informed consent, the colonoscope was                     passed under direct vision. Throughout the procedure, the                     patient's blood pressure, pulse, and oxygen saturations                     were monitored continuously. The Colonoscope was                     introduced through the anus and advanced to the the cecum,                     identified by appendiceal orifice and ileocecal valve. The                     colonoscopy was performed without difficulty. The patient                     tolerated the procedure well. The quality of the bowel                     preparation was good. Findings:      The scope was passed to the cecum and then into the terminl ileum..      The colon (entire examined portion) appeared normal. Fecal Microbiota       Transplant (Bacteriotherapy): Donor stool was prepared by me using milk       as per protocol. Approximately 350 mL of the emulsified donor stool was       instilled in the transverse colon, at the hepatic flexure, in the       ascending colon, in the cecum and in the distal ileum. A detailed   colonoscopic exam could not be performed upon scope withdrawal secondary       to limited visibility from the instilled stool.      Internal hemorrhoids were found during endoscopy. The hemorrhoids were       small, medium-sized and Grade I (internal hemorrhoids that do not  prolapse).      The exam was otherwise without abnormality. Impression:        - The entire examined colon is normal.                    - Internal hemorrhoids.                    - The examination was otherwise normal.                    - Fecal Microbiota Transplant (Bacteriotherapy) performed                     in the transverse colon, in the hepatic flexure, in the                     ascending colon, in the cecum and in the distal ileum.                    - No specimens collected. Recommendation:    - remain in post op for 4 hours wiith change in position                     every 30 min. Manya Silvas, MD 04/23/2015 11:47:59 AM This report has been signed electronically. Number of Addenda: 0 Note Initiated On: 04/23/2015 11:07 AM Scope Withdrawal Time: 0 hours 11 minutes 36 seconds  Total Procedure Duration: 0 hours 18 minutes 33 seconds       [facility]

## 2015-04-23 NOTE — OR Nursing (Signed)
Patient d/c from monitors and IV. To remain in post op x4 hours rotating left/back/right.

## 2015-04-23 NOTE — Anesthesia Preprocedure Evaluation (Signed)
Anesthesia Evaluation  Patient identified by MRN, date of birth, ID band Patient awake    Reviewed: Allergy & Precautions, Patient's Chart, lab work & pertinent test results  History of Anesthesia Complications Negative for: history of anesthetic complications  Airway Mallampati: II       Dental  (+) Edentulous Upper, Edentulous Lower   Pulmonary COPDCurrent Smoker,  + rhonchi   + decreased breath sounds      Cardiovascular + CAD and +CHF Rhythm:Regular Rate:Normal     Neuro/Psych Seizures -,  CVA    GI/Hepatic (+)     substance abuse  alcohol use,   Endo/Other  negative endocrine ROS  Renal/GU negative Renal ROS  negative genitourinary   Musculoskeletal negative musculoskeletal ROS (+)   Abdominal   Peds negative pediatric ROS (+)  Hematology negative hematology ROS (+)   Anesthesia Other Findings   Reproductive/Obstetrics                             Anesthesia Physical Anesthesia Plan  ASA: III  Anesthesia Plan: General   Post-op Pain Management:    Induction: Intravenous  Airway Management Planned: Nasal Cannula  Additional Equipment:   Intra-op Plan:   Post-operative Plan:   Informed Consent: I have reviewed the patients History and Physical, chart, labs and discussed the procedure including the risks, benefits and alternatives for the proposed anesthesia with the patient or authorized representative who has indicated his/her understanding and acceptance.     Plan Discussed with: CRNA  Anesthesia Plan Comments:         Anesthesia Quick Evaluation

## 2015-04-23 NOTE — H&P (Signed)
Primary Care Physician:  Juluis Pitch, MD Primary Gastroenterologist:  Dr. Vira Agar  Pre-Procedure History & Physical: HPI:  [name] is a 59 y.o. female is here for an colonoscopy.   Past Medical History  Diagnosis Date  . ETOH abuse   . CVA (cerebral infarction)   . Acute CHF (congestive heart failure)   . Stress-induced cardiomyopathy   . Clostridium difficile colitis   . Seizures   . Hyperlipidemia   . Cancer   . Cervical cancer   . Throat cancer     Past Surgical History  Procedure Laterality Date  . Coronary artery bypass graft      Prior to Admission medications   Medication Sig Start Date End Date Taking? Authorizing Provider  acidophilus (RISAQUAD) CAPS capsule Take 2 capsules by mouth 2 (two) times daily with a meal. 02/27/15   Fritzi Mandes, MD  atorvastatin (LIPITOR) 10 MG tablet Take 10 mg by mouth at bedtime.    Historical Provider, MD  clonazePAM (KLONOPIN) 0.5 MG tablet Take 0.5 mg by mouth 3 (three) times daily.    Historical Provider, MD  clopidogrel (PLAVIX) 75 MG tablet Take 75 mg by mouth daily.    Historical Provider, MD  donepezil (ARICEPT) 5 MG tablet Take 5 mg by mouth at bedtime.    Historical Provider, MD  famotidine (PEPCID) 20 MG tablet Take 20 mg by mouth 2 (two) times daily.    Historical Provider, MD  feeding supplement, ENSURE ENLIVE, (ENSURE ENLIVE) LIQD Take 237 mLs by mouth 3 (three) times daily with meals. 02/27/15   Fritzi Mandes, MD  fentaNYL (DURAGESIC - DOSED MCG/HR) 25 MCG/HR patch Place 25 mcg onto the skin every 3 (three) days.    Historical Provider, MD  folic acid (FOLVITE) 465 MCG tablet Take 800 mcg by mouth daily.    Historical Provider, MD  furosemide (LASIX) 40 MG tablet Take 0.5 tablets (20 mg total) by mouth daily. 02/27/15   Fritzi Mandes, MD  levETIRAcetam (KEPPRA) 500 MG tablet Take 500 mg by mouth 2 (two) times daily.    Historical Provider, MD  magnesium oxide (MAG-OX) 400 MG tablet Take 400 mg by mouth daily.    Historical  Provider, MD  Multiple Vitamin (MULTIVITAMIN) tablet Take 1 tablet by mouth daily.    Historical Provider, MD  predniSONE (DELTASONE) 5 MG tablet Take 5 mg by mouth daily.    Historical Provider, MD  QUEtiapine (SEROQUEL) 25 MG tablet Take 25 mg by mouth at bedtime.    Historical Provider, MD  sertraline (ZOLOFT) 100 MG tablet Take 100 mg by mouth at bedtime.    Historical Provider, MD  thiamine (VITAMIN B-1) 100 MG tablet Take 100 mg by mouth daily.    Historical Provider, MD  vancomycin (VANCOCIN) 50 mg/mL oral solution Take 5 mLs (250 mg total) by mouth every 6 (six) hours. 02/27/15   Fritzi Mandes, MD  vitamin B-12 (CYANOCOBALAMIN) 1000 MCG tablet Take 1,000 mcg by mouth daily.    Historical Provider, MD    Allergies as of 04/03/2015 - Review Complete 02/25/2015  Allergen Reaction Noted  . Geodon [ziprasidone hcl] Other (See Comments) 02/25/2015  . Haldol [haloperidol] Other (See Comments) 02/25/2015    Family History  Problem Relation Age of Onset  . Diabetes Mother   . Lung cancer Father     History   Social History  . Marital Status: Married    Spouse Name: N/A  . Number of Children: N/A  . Years of Education:  N/A   Occupational History  . Not on file.   Social History Main Topics  . Smoking status: Current Every Day Smoker -- 1.00 packs/day    Types: Cigarettes  . Smokeless tobacco: Not on file  . Alcohol Use: Yes  . Drug Use: No  . Sexual Activity: Not on file   Other Topics Concern  . Not on file   Social History Narrative    Review of Systems: See HPI, otherwise negative ROS  Physical Exam: BP 120/78 mmHg  Pulse 71  Temp(Src) 97.4 F (36.3 C) (Tympanic)  Resp 17  Ht 5\' 2"  (1.575 m)  Wt 45.36 kg (100 lb)  BMI 18.29 kg/m2  SpO2 100% General:   Alert,  pleasant and cooperative in NAD Head:  Normocephalic and atraumatic. Neck:  Supple; no masses or thyromegaly. Lungs:  Clear throughout to auscultation.    Heart:  Regular rate and rhythm. Abdomen:   Soft, nontender and nondistended. Normal bowel sounds, without guarding, and without rebound.   Neurologic:  Alert and  oriented x4;  grossly normal neurologically.  Impression/Plan: [name] is here for an colonoscopy to be performed for fecal microbial transplant  Risks, benefits, limitations, and alternatives regarding  colonoscopy have been reviewed with the patient.  Questions have been answered.  All parties agreeable.   Jean Cheers, MD  04/23/2015, 11:12 AM   Primary Care Physician:  Juluis Pitch, MD Primary Gastroenterologist:  Dr. Vira Agar  Pre-Procedure History & Physical: HPI:  [name] is a 59 y.o. female is here for an colonoscopy.   Past Medical History  Diagnosis Date  . ETOH abuse   . CVA (cerebral infarction)   . Acute CHF (congestive heart failure)   . Stress-induced cardiomyopathy   . Clostridium difficile colitis   . Seizures   . Hyperlipidemia   . Cancer   . Cervical cancer   . Throat cancer     Past Surgical History  Procedure Laterality Date  . Coronary artery bypass graft      Prior to Admission medications   Medication Sig Start Date End Date Taking? Authorizing Provider  acidophilus (RISAQUAD) CAPS capsule Take 2 capsules by mouth 2 (two) times daily with a meal. 02/27/15   Fritzi Mandes, MD  atorvastatin (LIPITOR) 10 MG tablet Take 10 mg by mouth at bedtime.    Historical Provider, MD  clonazePAM (KLONOPIN) 0.5 MG tablet Take 0.5 mg by mouth 3 (three) times daily.    Historical Provider, MD  clopidogrel (PLAVIX) 75 MG tablet Take 75 mg by mouth daily.    Historical Provider, MD  donepezil (ARICEPT) 5 MG tablet Take 5 mg by mouth at bedtime.    Historical Provider, MD  famotidine (PEPCID) 20 MG tablet Take 20 mg by mouth 2 (two) times daily.    Historical Provider, MD  feeding supplement, ENSURE ENLIVE, (ENSURE ENLIVE) LIQD Take 237 mLs by mouth 3 (three) times daily with meals. 02/27/15   Fritzi Mandes, MD  fentaNYL (DURAGESIC - DOSED  MCG/HR) 25 MCG/HR patch Place 25 mcg onto the skin every 3 (three) days.    Historical Provider, MD  folic acid (FOLVITE) 759 MCG tablet Take 800 mcg by mouth daily.    Historical Provider, MD  furosemide (LASIX) 40 MG tablet Take 0.5 tablets (20 mg total) by mouth daily. 02/27/15   Fritzi Mandes, MD  levETIRAcetam (KEPPRA) 500 MG tablet Take 500 mg by mouth 2 (two) times daily.    Historical Provider, MD  magnesium oxide (MAG-OX)  400 MG tablet Take 400 mg by mouth daily.    Historical Provider, MD  Multiple Vitamin (MULTIVITAMIN) tablet Take 1 tablet by mouth daily.    Historical Provider, MD  predniSONE (DELTASONE) 5 MG tablet Take 5 mg by mouth daily.    Historical Provider, MD  QUEtiapine (SEROQUEL) 25 MG tablet Take 25 mg by mouth at bedtime.    Historical Provider, MD  sertraline (ZOLOFT) 100 MG tablet Take 100 mg by mouth at bedtime.    Historical Provider, MD  thiamine (VITAMIN B-1) 100 MG tablet Take 100 mg by mouth daily.    Historical Provider, MD  vancomycin (VANCOCIN) 50 mg/mL oral solution Take 5 mLs (250 mg total) by mouth every 6 (six) hours. 02/27/15   Fritzi Mandes, MD  vitamin B-12 (CYANOCOBALAMIN) 1000 MCG tablet Take 1,000 mcg by mouth daily.    Historical Provider, MD    Allergies as of 04/03/2015 - Review Complete 02/25/2015  Allergen Reaction Noted  . Geodon [ziprasidone hcl] Other (See Comments) 02/25/2015  . Haldol [haloperidol] Other (See Comments) 02/25/2015    Family History  Problem Relation Age of Onset  . Diabetes Mother   . Lung cancer Father     History   Social History  . Marital Status: Married    Spouse Name: N/A  . Number of Children: N/A  . Years of Education: N/A   Occupational History  . Not on file.   Social History Main Topics  . Smoking status: Current Every Day Smoker -- 1.00 packs/day    Types: Cigarettes  . Smokeless tobacco: Not on file  . Alcohol Use: Yes  . Drug Use: No  . Sexual Activity: Not on file   Other Topics Concern  .  Not on file   Social History Narrative    Review of Systems: See HPI, otherwise negative ROS  Physical Exam: BP 120/78 mmHg  Pulse 71  Temp(Src) 97.4 F (36.3 C) (Tympanic)  Resp 17  Ht 5\' 2"  (1.575 m)  Wt 45.36 kg (100 lb)  BMI 18.29 kg/m2  SpO2 100% General:   Alert,  pleasant and cooperative in NAD Head:  Normocephalic and atraumatic. Neck:  Supple; no masses or thyromegaly. Lungs:  Clear throughout to auscultation.    Heart:  Regular rate and rhythm. Abdomen:  Soft, nontender and nondistended. Normal bowel sounds, without guarding, and without rebound.   Neurologic:  Alert and  oriented x4;  grossly normal neurologically.  Impression/Plan: [name] is here for an colonoscopy to be performed for fecal microbial transplant  Risks, benefits, limitations, and alternatives regarding  colonoscopy have been reviewed with the patient.  Questions have been answered.  All parties agreeable.   Jean Cheers, MD  04/23/2015, 11:12 AM

## 2015-04-23 NOTE — Anesthesia Postprocedure Evaluation (Signed)
  Anesthesia Post-op Note  Patient: [name]  Procedure(s) Performed: Procedure(s): COLONOSCOPY WITH PROPOFOL (N/A) FECAL TRANSPLANT (N/A)  Anesthesia type:General  Patient location: PACU  Post pain: Pain level controlled  Post assessment: Post-op Vital signs reviewed, Patient's Cardiovascular Status Stable, Respiratory Function Stable, Patent Airway and No signs of Nausea or vomiting  Post vital signs: Reviewed and stable  Last Vitals:  Filed Vitals:   04/23/15 1150  BP: 145/73  Pulse:   Temp:   Resp: 15    Level of consciousness: awake, alert  and patient cooperative  Complications: No apparent anesthesia complications

## 2015-04-24 ENCOUNTER — Encounter: Payer: Self-pay | Admitting: Unknown Physician Specialty

## 2015-10-03 ENCOUNTER — Inpatient Hospital Stay
Admission: EM | Admit: 2015-10-03 | Discharge: 2015-10-06 | DRG: 100 | Disposition: A | Discharge status: Skilled Nursing Facility | Payer: Medicare Other | Attending: Internal Medicine | Admitting: Internal Medicine

## 2015-10-03 ENCOUNTER — Emergency Department: Payer: Medicare Other

## 2015-10-03 ENCOUNTER — Encounter: Payer: Self-pay | Admitting: Emergency Medicine

## 2015-10-03 DIAGNOSIS — G40909 Epilepsy, unspecified, not intractable, without status epilepticus: Secondary | ICD-10-CM | POA: Diagnosis not present

## 2015-10-03 DIAGNOSIS — Z7902 Long term (current) use of antithrombotics/antiplatelets: Secondary | ICD-10-CM

## 2015-10-03 DIAGNOSIS — F39 Unspecified mood [affective] disorder: Secondary | ICD-10-CM | POA: Diagnosis present

## 2015-10-03 DIAGNOSIS — Y92009 Unspecified place in unspecified non-institutional (private) residence as the place of occurrence of the external cause: Secondary | ICD-10-CM

## 2015-10-03 DIAGNOSIS — Z85819 Personal history of malignant neoplasm of unspecified site of lip, oral cavity, and pharynx: Secondary | ICD-10-CM

## 2015-10-03 DIAGNOSIS — Z7952 Long term (current) use of systemic steroids: Secondary | ICD-10-CM

## 2015-10-03 DIAGNOSIS — G9340 Encephalopathy, unspecified: Secondary | ICD-10-CM | POA: Diagnosis present

## 2015-10-03 DIAGNOSIS — I2510 Atherosclerotic heart disease of native coronary artery without angina pectoris: Secondary | ICD-10-CM | POA: Diagnosis present

## 2015-10-03 DIAGNOSIS — Z8541 Personal history of malignant neoplasm of cervix uteri: Secondary | ICD-10-CM

## 2015-10-03 DIAGNOSIS — W19XXXA Unspecified fall, initial encounter: Secondary | ICD-10-CM

## 2015-10-03 DIAGNOSIS — S51811A Laceration without foreign body of right forearm, initial encounter: Secondary | ICD-10-CM | POA: Diagnosis present

## 2015-10-03 DIAGNOSIS — Z8673 Personal history of transient ischemic attack (TIA), and cerebral infarction without residual deficits: Secondary | ICD-10-CM

## 2015-10-03 DIAGNOSIS — Z79899 Other long term (current) drug therapy: Secondary | ICD-10-CM

## 2015-10-03 DIAGNOSIS — E785 Hyperlipidemia, unspecified: Secondary | ICD-10-CM | POA: Diagnosis present

## 2015-10-03 DIAGNOSIS — T426X6A Underdosing of other antiepileptic and sedative-hypnotic drugs, initial encounter: Secondary | ICD-10-CM | POA: Diagnosis present

## 2015-10-03 DIAGNOSIS — W01190A Fall on same level from slipping, tripping and stumbling with subsequent striking against furniture, initial encounter: Secondary | ICD-10-CM | POA: Diagnosis present

## 2015-10-03 DIAGNOSIS — E876 Hypokalemia: Secondary | ICD-10-CM | POA: Diagnosis present

## 2015-10-03 DIAGNOSIS — G9349 Other encephalopathy: Secondary | ICD-10-CM | POA: Diagnosis present

## 2015-10-03 DIAGNOSIS — R569 Unspecified convulsions: Secondary | ICD-10-CM

## 2015-10-03 DIAGNOSIS — F17210 Nicotine dependence, cigarettes, uncomplicated: Secondary | ICD-10-CM | POA: Diagnosis present

## 2015-10-03 DIAGNOSIS — R000 Tachycardia, unspecified: Secondary | ICD-10-CM | POA: Diagnosis present

## 2015-10-03 LAB — CBC WITH DIFFERENTIAL/PLATELET
BASOS ABS: 0 10*3/uL (ref 0–0.1)
Basophils Relative: 0 %
Eosinophils Absolute: 0.1 10*3/uL (ref 0–0.7)
Eosinophils Relative: 1 %
HEMATOCRIT: 32.9 % — AB (ref 35.0–47.0)
HEMOGLOBIN: 10.8 g/dL — AB (ref 12.0–16.0)
LYMPHS ABS: 1.8 10*3/uL (ref 1.0–3.6)
Lymphocytes Relative: 26 %
MCH: 25 pg — AB (ref 26.0–34.0)
MCHC: 32.7 g/dL (ref 32.0–36.0)
MCV: 76.4 fL — AB (ref 80.0–100.0)
Monocytes Absolute: 0.9 10*3/uL (ref 0.2–0.9)
Monocytes Relative: 13 %
NEUTROS ABS: 4.3 10*3/uL (ref 1.4–6.5)
Neutrophils Relative %: 60 %
Platelets: 325 10*3/uL (ref 150–440)
RBC: 4.31 MIL/uL (ref 3.80–5.20)
RDW: 17.7 % — ABNORMAL HIGH (ref 11.5–14.5)
WBC: 7.1 10*3/uL (ref 3.6–11.0)

## 2015-10-03 LAB — URINE DRUG SCREEN, QUALITATIVE (ARMC ONLY)
AMPHETAMINES, UR SCREEN: NOT DETECTED
BENZODIAZEPINE, UR SCRN: POSITIVE — AB
Barbiturates, Ur Screen: NOT DETECTED
Cannabinoid 50 Ng, Ur ~~LOC~~: NOT DETECTED
Cocaine Metabolite,Ur ~~LOC~~: NOT DETECTED
MDMA (ECSTASY) UR SCREEN: NOT DETECTED
METHADONE SCREEN, URINE: NOT DETECTED
OPIATE, UR SCREEN: NOT DETECTED
Phencyclidine (PCP) Ur S: NOT DETECTED
Tricyclic, Ur Screen: NOT DETECTED

## 2015-10-03 LAB — COMPREHENSIVE METABOLIC PANEL
ALK PHOS: 67 U/L (ref 38–126)
ALT: 8 U/L — AB (ref 14–54)
AST: 15 U/L (ref 15–41)
Albumin: 3.3 g/dL — ABNORMAL LOW (ref 3.5–5.0)
Anion gap: 11 (ref 5–15)
BILIRUBIN TOTAL: 0.5 mg/dL (ref 0.3–1.2)
BUN: 10 mg/dL (ref 6–20)
CALCIUM: 9.5 mg/dL (ref 8.9–10.3)
CO2: 27 mmol/L (ref 22–32)
CREATININE: 0.97 mg/dL (ref 0.44–1.00)
Chloride: 105 mmol/L (ref 101–111)
Glucose, Bld: 100 mg/dL — ABNORMAL HIGH (ref 65–99)
Potassium: 3.1 mmol/L — ABNORMAL LOW (ref 3.5–5.1)
Sodium: 143 mmol/L (ref 135–145)
Total Protein: 7.6 g/dL (ref 6.5–8.1)

## 2015-10-03 LAB — URINALYSIS COMPLETE WITH MICROSCOPIC (ARMC ONLY)
BILIRUBIN URINE: NEGATIVE
Bacteria, UA: NONE SEEN
Glucose, UA: NEGATIVE mg/dL
Hgb urine dipstick: NEGATIVE
Ketones, ur: NEGATIVE mg/dL
Leukocytes, UA: NEGATIVE
Nitrite: NEGATIVE
Protein, ur: NEGATIVE mg/dL
Specific Gravity, Urine: 1.005 (ref 1.005–1.030)
pH: 6 (ref 5.0–8.0)

## 2015-10-03 LAB — CK: Total CK: 32 U/L — ABNORMAL LOW (ref 38–234)

## 2015-10-03 LAB — TROPONIN I: Troponin I: <0.03 ng/mL (ref <0.031)

## 2015-10-03 LAB — ETHANOL

## 2015-10-03 LAB — HEMOGLOBIN A1C: Hgb A1c MFr Bld: 5.1 % (ref 4.0–6.0)

## 2015-10-03 LAB — TSH: TSH: 6.121 u[IU]/mL — ABNORMAL HIGH (ref 0.350–4.500)

## 2015-10-03 MED ORDER — ADULT MULTIVITAMIN W/MINERALS CH
1.0000 | ORAL_TABLET | Freq: Every day | ORAL | Status: DC
  Administered 2015-10-03: 1 via ORAL
  Filled 2015-10-03: qty 1

## 2015-10-03 MED ORDER — PREDNISONE 5 MG PO TABS
5.0000 mg | ORAL_TABLET | Freq: Every day | ORAL | Status: DC
  Administered 2015-10-03 – 2015-10-06 (×4): 5 mg via ORAL
  Filled 2015-10-03 (×4): qty 1

## 2015-10-03 MED ORDER — ENSURE ENLIVE PO LIQD
237.0000 mL | Freq: Three times a day (TID) | ORAL | Status: DC
  Administered 2015-10-03 – 2015-10-04 (×4): 237 mL via ORAL

## 2015-10-03 MED ORDER — ONDANSETRON HCL 4 MG PO TABS: 4.0000 mg | ORAL_TABLET | Freq: Four times a day (QID) | ORAL | Status: DC | PRN

## 2015-10-03 MED ORDER — RISAQUAD PO CAPS
2.0000 | ORAL_CAPSULE | Freq: Two times a day (BID) | ORAL | Status: DC
  Administered 2015-10-03 – 2015-10-06 (×6): 2 via ORAL
  Filled 2015-10-03 (×6): qty 2

## 2015-10-03 MED ORDER — THIAMINE HCL 100 MG/ML IJ SOLN
Freq: Once | INTRAVENOUS | Status: AC
  Administered 2015-10-03: 09:00:00 via INTRAVENOUS
  Filled 2015-10-03: qty 1000

## 2015-10-03 MED ORDER — QUETIAPINE FUMARATE 25 MG PO TABS: 25.0000 mg | ORAL_TABLET | Freq: Every day | ORAL | Status: DC

## 2015-10-03 MED ORDER — DOCUSATE SODIUM 100 MG PO CAPS
100.0000 mg | ORAL_CAPSULE | Freq: Two times a day (BID) | ORAL | Status: DC
  Administered 2015-10-03 – 2015-10-06 (×5): 100 mg via ORAL
  Filled 2015-10-03 (×6): qty 1

## 2015-10-03 MED ORDER — LORAZEPAM 2 MG/ML IJ SOLN
2.0000 mg | Freq: Once | INTRAMUSCULAR | Status: AC
  Administered 2015-10-03: 2 mg via INTRAMUSCULAR

## 2015-10-03 MED ORDER — DONEPEZIL HCL 5 MG PO TABS
5.0000 mg | ORAL_TABLET | Freq: Every day | ORAL | Status: DC
  Administered 2015-10-03 – 2015-10-05 (×3): 5 mg via ORAL
  Filled 2015-10-03 (×3): qty 1

## 2015-10-03 MED ORDER — SERTRALINE HCL 100 MG PO TABS: 100.0000 mg | ORAL_TABLET | Freq: Every day | ORAL | Status: DC

## 2015-10-03 MED ORDER — ACETAMINOPHEN 325 MG PO TABS: 650.0000 mg | ORAL_TABLET | Freq: Four times a day (QID) | ORAL | Status: DC | PRN

## 2015-10-03 MED ORDER — ONDANSETRON HCL 4 MG/2ML IJ SOLN: 4.0000 mg | Freq: Four times a day (QID) | INTRAMUSCULAR | Status: DC | PRN

## 2015-10-03 MED ORDER — FOLIC ACID 1 MG PO TABS
1000.0000 ug | ORAL_TABLET | Freq: Every day | ORAL | Status: DC
  Administered 2015-10-03: 1 mg via ORAL
  Filled 2015-10-03: qty 1

## 2015-10-03 MED ORDER — POTASSIUM CHLORIDE CRYS ER 20 MEQ PO TBCR
40.0000 meq | EXTENDED_RELEASE_TABLET | Freq: Once | ORAL | Status: AC
  Administered 2015-10-03: 40 meq via ORAL
  Filled 2015-10-03: qty 2

## 2015-10-03 MED ORDER — LORAZEPAM 2 MG/ML IJ SOLN: 2.0000 mg | Freq: Once | INTRAMUSCULAR | Status: AC

## 2015-10-03 MED ORDER — POTASSIUM CHLORIDE 20 MEQ PO PACK
40.0000 meq | PACK | Freq: Once | ORAL | Status: AC
  Administered 2015-10-03: 40 meq via ORAL
  Filled 2015-10-03: qty 2

## 2015-10-03 MED ORDER — FENTANYL 12 MCG/HR TD PT72
25.0000 ug | MEDICATED_PATCH | TRANSDERMAL | Status: DC
  Filled 2015-10-03: qty 2

## 2015-10-03 MED ORDER — LORAZEPAM 2 MG/ML IJ SOLN
INTRAMUSCULAR | Status: AC
  Administered 2015-10-03: 2 mg via INTRAMUSCULAR
  Filled 2015-10-03: qty 1

## 2015-10-03 MED ORDER — MORPHINE SULFATE (PF) 2 MG/ML IV SOLN: 1.0000 mg | INTRAVENOUS | Status: DC | PRN

## 2015-10-03 MED ORDER — FUROSEMIDE 20 MG PO TABS
20.0000 mg | ORAL_TABLET | Freq: Every day | ORAL | Status: DC
  Administered 2015-10-03 – 2015-10-06 (×4): 20 mg via ORAL
  Filled 2015-10-03 (×4): qty 1

## 2015-10-03 MED ORDER — VANCOMYCIN 50 MG/ML ORAL SOLUTION
250.0000 mg | Freq: Four times a day (QID) | ORAL | Status: DC
  Administered 2015-10-03 (×2): 250 mg via ORAL
  Filled 2015-10-03 (×5): qty 5

## 2015-10-03 MED ORDER — FENTANYL 25 MCG/HR TD PT72
25.0000 ug | MEDICATED_PATCH | TRANSDERMAL | Status: DC
  Filled 2015-10-03: qty 1

## 2015-10-03 MED ORDER — LEVETIRACETAM 500 MG PO TABS
500.0000 mg | ORAL_TABLET | Freq: Two times a day (BID) | ORAL | Status: DC
  Administered 2015-10-03 – 2015-10-06 (×7): 500 mg via ORAL
  Filled 2015-10-03 (×9): qty 1

## 2015-10-03 MED ORDER — ATORVASTATIN CALCIUM 10 MG PO TABS
10.0000 mg | ORAL_TABLET | Freq: Every day | ORAL | Status: DC
  Administered 2015-10-03 – 2015-10-05 (×3): 10 mg via ORAL
  Filled 2015-10-03 (×3): qty 1

## 2015-10-03 MED ORDER — SODIUM CHLORIDE 0.9 % IJ SOLN
3.0000 mL | Freq: Two times a day (BID) | INTRAMUSCULAR | Status: DC
  Administered 2015-10-03 – 2015-10-06 (×5): 3 mL via INTRAVENOUS

## 2015-10-03 MED ORDER — MAGNESIUM OXIDE 400 (241.3 MG) MG PO TABS
400.0000 mg | ORAL_TABLET | Freq: Every day | ORAL | Status: DC
  Administered 2015-10-03 – 2015-10-06 (×4): 400 mg via ORAL
  Filled 2015-10-03 (×4): qty 1

## 2015-10-03 MED ORDER — ACETAMINOPHEN 650 MG RE SUPP: 650.0000 mg | Freq: Four times a day (QID) | RECTAL | Status: DC | PRN

## 2015-10-03 MED ORDER — FAMOTIDINE 20 MG PO TABS
20.0000 mg | ORAL_TABLET | Freq: Every day | ORAL | Status: DC
  Administered 2015-10-03 – 2015-10-06 (×4): 20 mg via ORAL
  Filled 2015-10-03 (×4): qty 1

## 2015-10-03 MED ORDER — HEPARIN SODIUM (PORCINE) 5000 UNIT/ML IJ SOLN
5000.0000 [IU] | Freq: Three times a day (TID) | INTRAMUSCULAR | Status: DC
  Administered 2015-10-03 – 2015-10-04 (×5): 5000 [IU] via SUBCUTANEOUS
  Filled 2015-10-03 (×5): qty 1

## 2015-10-03 MED ORDER — VITAMIN B-1 100 MG PO TABS
100.0000 mg | ORAL_TABLET | Freq: Every day | ORAL | Status: DC
  Administered 2015-10-03: 100 mg via ORAL
  Filled 2015-10-03: qty 1

## 2015-10-03 MED ORDER — LORAZEPAM 2 MG/ML IJ SOLN
2.0000 mg | INTRAMUSCULAR | Status: DC | PRN
  Administered 2015-10-03 – 2015-10-04 (×2): 2 mg via INTRAVENOUS
  Filled 2015-10-03 (×2): qty 1

## 2015-10-03 MED ORDER — CLONAZEPAM 0.5 MG PO TABS
0.5000 mg | ORAL_TABLET | Freq: Three times a day (TID) | ORAL | Status: DC
  Administered 2015-10-03 – 2015-10-06 (×9): 0.5 mg via ORAL
  Filled 2015-10-03 (×9): qty 1

## 2015-10-03 MED ORDER — SODIUM CHLORIDE 0.9 % IV SOLN
500.0000 mg | Freq: Once | INTRAVENOUS | Status: AC
  Administered 2015-10-03: 500 mg via INTRAVENOUS
  Filled 2015-10-03: qty 5

## 2015-10-03 MED ORDER — SODIUM CHLORIDE 0.9 % IV BOLUS (SEPSIS)
1000.0000 mL | Freq: Once | INTRAVENOUS | Status: AC
  Administered 2015-10-03: 1000 mL via INTRAVENOUS

## 2015-10-03 MED ORDER — VITAMIN B-12 1000 MCG PO TABS
1000.0000 ug | ORAL_TABLET | Freq: Every day | ORAL | Status: DC
  Administered 2015-10-03 – 2015-10-06 (×4): 1000 ug via ORAL
  Filled 2015-10-03 (×4): qty 1

## 2015-10-03 MED ORDER — CLOPIDOGREL BISULFATE 75 MG PO TABS
75.0000 mg | ORAL_TABLET | Freq: Every day | ORAL | Status: DC
  Administered 2015-10-03 – 2015-10-06 (×4): 75 mg via ORAL
  Filled 2015-10-03 (×4): qty 1

## 2015-10-03 NOTE — Care Management (Signed)
Spoke with patient niece Roderic Ovens with whom the patient resides. Patient was sleeping soundly. Niece stated that she normally does very well at home and does not use a walker or cane although she does have those available in the home. [facility] for Rehab. Niece stated that the patient was at her friends house visiting when she had a Seizure and was brought to ER. PT unable to work with the patient today due to lethargy. Issue was relayed to attending by nurse. Anticipate discharge when patient is a baseline.  Continue to follow.

## 2015-10-03 NOTE — ED Notes (Signed)
Pt alert and oriented at this time. Seizure pads on siderails and padding on floor around pt's bed.

## 2015-10-03 NOTE — Progress Notes (Signed)
Notified Dr. Margaretmary Eddy that patient had a fall. Pt stated that nothing hurt. VSS. Will notify Family

## 2015-10-03 NOTE — ED Notes (Signed)
Patient observed resting in bed with NAD noted. Patient with even and non-labored respirations. No anticipated needs. Will continue to monitor.

## 2015-10-03 NOTE — ED Notes (Signed)
Patient was noted to be sitting on the floor next to bed. States she didn't fall, she sat down. Asked where she was going and she said to get more blankets. Patient assisted back to bed. No injuries noted, no abrasions noted, states she did not hit her head. Reoriented patient to callbell system and showed her where it was on the railing. Seizure pads and floor pads remain in place.

## 2015-10-03 NOTE — Progress Notes (Signed)
[facility] [place] NAME: [name]    MR#:  [id number]  DATE OF BIRTH:  [birth year]  SUBJECTIVE:  CHIEF COMPLAINT:  Patient is arousable but falling asleep. Not completely awake and alert.  REVIEW OF SYSTEMS:  Review of system is unobtainable secondary to lethargy  DRUG ALLERGIES:   Allergies  Allergen Reactions  . Geodon [Ziprasidone Hcl] Other (See Comments)    Unknown reaction  . Haldol [Haloperidol] Other (See Comments)    Unknown reaction    VITALS:  Blood pressure 116/77, pulse 100, temperature 98.5 F (36.9 C), temperature source Oral, resp. rate 18, height 5\' 1"  (1.549 m), weight 46.358 kg (102 lb 3.2 oz), SpO2 100 %.  PHYSICAL EXAMINATION:  GENERAL:  59 y.o.-year-old patient lying in the bed with no acute distress.  EYES: Pupils equal, round, reactive to light and accommodation. No scleral icterus.   HEENT: Head atraumatic, normocephalic. Oropharynx and nasopharynx clear.  NECK:  Supple, no jugular venous distention. No thyroid enlargement, no tenderness.  LUNGS: Normal breath sounds bilaterally, no wheezing, rales,rhonchi or crepitation. No use of accessory muscles of respiration.  CARDIOVASCULAR: S1, S2 normal. No murmurs, rubs, or gallops.  ABDOMEN: Soft, nontender, nondistended. Bowel sounds present. No organomegaly or mass.  EXTREMITIES: No pedal edema, cyanosis, or clubbing.  NEUROLOGIC: altered mentation PSYCHIATRIC: The patient is With altered mental status SKIN: No obvious rash, lesion, or ulcer.    LABORATORY PANEL:   CBC  Recent Labs Lab 10/03/15 0145  WBC 7.1  HGB 10.8*  HCT 32.9*  PLT 325   ------------------------------------------------------------------------------------------------------------------  Chemistries   Recent Labs Lab 10/03/15 0145  NA 143  K 3.1*  CL 105  CO2 27  GLUCOSE 100*  BUN 10  CREATININE 0.97  CALCIUM 9.5  AST 15  ALT 8*  ALKPHOS 67  BILITOT 0.5    ------------------------------------------------------------------------------------------------------------------  Cardiac Enzymes  Recent Labs Lab 10/03/15 0145  TROPONINI <0.03   ------------------------------------------------------------------------------------------------------------------  RADIOLOGY:  Ct Head Wo Contrast  10/03/2015  CLINICAL DATA:  59 year old female with seizures and fall. EXAM: CT HEAD WITHOUT CONTRAST TECHNIQUE: Contiguous axial images were obtained from the base of the skull through the vertex without intravenous contrast. COMPARISON:  Brain MRI dated 07/03/2015 and head CT dated 11/20/2014 FINDINGS: The ventricles are dilated and the sulci are prominent compatible with age-related atrophy. Periventricular and deep white matter hypodensities represent chronic microvascular ischemic changes. Left cerebellar old infarct and encephalomalacia. Right parietal occipital infarct and encephalomalacia changes. There is no intracranial hemorrhage. No mass effect or midline shift identified. The visualized paranasal sinuses and mastoid air cells are well aerated. The calvarium is intact. IMPRESSION: No acute intracranial hemorrhage. Age-related atrophy and chronic microvascular ischemic disease. Large left cerebellar and right posterior parietal old infarcts and encephalomalacia. Electronically Signed   By: Anner Crete M.D.   On: 10/03/2015 03:31   Dg Chest Port 1 View  10/03/2015  CLINICAL DATA:  59 year old female with seizures and fall. Laceration to the right forearm. EXAM: PORTABLE CHEST 1 VIEW COMPARISON:  Chest radiograph dated 12/14/2014 FINDINGS: Single-view of the chest does not demonstrate any focal consolidation. No pleural effusion or pneumothorax. Stable cardiac silhouette. Mechanical cardiac valve and median sternotomy wires noted. The osseous structures are grossly unremarkable. IMPRESSION: No active disease. Electronically Signed   By: Anner Crete  M.D.   On: 10/03/2015 03:10    EKG:   Orders placed or performed during the hospital encounter of 02/25/15  .  ED EKG  . ED EKG  . EKG 12-Lead  . EKG 12-Lead  . EKG    ASSESSMENT AND PLAN:  This is a 59 year old Afro-American female admitted for encephalopathy and seizure disorder. 1. Encephalopathy: Likely post ictal seizure from alcohol withdrawal, given history of alcohol abuse.  I will replete him thiamine and folic acid with a vitamin bag . Continue to monitor mental status.  Continue donepezil for  Dementia Monitor CIWA SCORE Consult NEUROLOGY  2. Seizure disorder: Possibly secondary to alcohol withdrawal. The patient also has a history of stroke and has old parietal infarcts.  Consult neurology-pending 3. Alcohol abuse: CIWA scale and benzodiazepines as necessary, sitter for safety, neuro checks 4. Coronary artery disease: Stable;. Continue Plavix  5.history of C. difficile colitis in June 2016: Patient with no diarrhea during this admission per medical staff  Continue acidophilus supplementation ,Discontinue oral vancomycin which was started at the time of admission  6. Lipidemia: Continue Lipitor for cerebrovascular protection 7. Mood disorder: hold sertraline and Seroquel as patient is with altered mentation 8. DVT prophylaxis: Heparin 9. GI prophylaxis: H2 blocker per home regimen (presumedly to decrease risk of aspiration)  10.elevated TSH-check free T 4 and T3  in a.m.   All the records are reviewed and case discussed with Care Management/Social Workerr. Management plans discussed with the patient, family and they are in agreement.  CODE STATUS: fc  TOTAL TIME TAKING CARE OF THIS PATIENT: 35 minutes.   POSSIBLE D/C IN 2-3 DAYS, DEPENDING ON CLINICAL CONDITION.   Nicholes Mango M.D on 10/03/2015 at 4:11 PM  Between 7am to 6pm - Pager - [phone] After 6pm go to [email or web address] - password [email or web address] [facility]  Office   [phone]  CC: Primary care physician; Juluis Pitch, MD

## 2015-10-03 NOTE — H&P (Signed)
[name] is an 59 y.o. female.   Chief Complaint: Fall HPI: The patient originally presents to the emergency department after suffering a fall. She states that her legs "gave out" she stood from a seated position to reach a TV remote. However in the emergency department the patient was witnessed to have a global tonic-clonic seizure area and she was given Ativan which stopped the seizure activity but remained postictal. She was found to have hypokalemia. There is also history of our call abuse all of which prompted the emergency department staff to call for admission.  Past Medical History  Diagnosis Date  . ETOH abuse   . CVA (cerebral infarction)   . Acute CHF (congestive heart failure) ([place])   . Stress-induced cardiomyopathy   . Clostridium difficile colitis   . Seizures ([place])   . Hyperlipidemia   . Cancer ([place])   . Cervical cancer ([place])   . Throat cancer [facility])     Past Surgical History  Procedure Laterality Date  . Coronary artery bypass graft    . Colonoscopy with propofol N/A 04/23/2015    Procedure: COLONOSCOPY WITH PROPOFOL;  Surgeon: Manya Silvas, MD;  Location: [facility] ENDOSCOPY;  Service: Endoscopy;  Laterality: N/A;  . 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 Diabetes Mother   . Lung cancer Father    Social History:  reports that she has been smoking Cigarettes.  She has been smoking about 1.00 pack per day. She does not have any smokeless tobacco history on file. She reports that she drinks alcohol. She reports that she does not use illicit drugs.  Allergies:  Allergies  Allergen Reactions  . Geodon [Ziprasidone Hcl] Other (See Comments)    Unknown reaction  . Haldol [Haloperidol] Other (See Comments)    Unknown reaction    Prior to Admission medications   Medication Sig Start Date End  Date Taking? Authorizing Provider  acidophilus (RISAQUAD) CAPS capsule Take 2 capsules by mouth 2 (two) times daily with a meal. 02/27/15   Fritzi Mandes, MD  atorvastatin (LIPITOR) 10 MG tablet Take 10 mg by mouth at bedtime.    Historical Provider, MD  clonazePAM (KLONOPIN) 0.5 MG tablet Take 0.5 mg by mouth 3 (three) times daily.    Historical Provider, MD  clopidogrel (PLAVIX) 75 MG tablet Take 75 mg by mouth daily.    Historical Provider, MD  donepezil (ARICEPT) 5 MG tablet Take 5 mg by mouth at bedtime.    Historical Provider, MD  famotidine (PEPCID) 20 MG tablet Take 20 mg by mouth 2 (two) times daily.    Historical Provider, MD  feeding supplement, ENSURE ENLIVE, (ENSURE ENLIVE) LIQD Take 237 mLs by mouth 3 (three) times daily with meals. 02/27/15   Fritzi Mandes, MD  fentaNYL (DURAGESIC - DOSED MCG/HR) 25 MCG/HR patch Place 25 mcg onto the skin every 3 (three) days.    Historical Provider, MD  folic acid (FOLVITE) 650 MCG tablet Take 800 mcg by mouth daily.    Historical Provider, MD  furosemide (LASIX) 40 MG tablet Take 0.5 tablets (20 mg total) by mouth daily. 02/27/15   Fritzi Mandes, MD  levETIRAcetam (KEPPRA) 500 MG tablet Take 500 mg by mouth 2 (two) times daily.    Historical Provider, MD  magnesium oxide (MAG-OX) 400 MG tablet Take 400 mg  by mouth daily.    Historical Provider, MD  Multiple Vitamin (MULTIVITAMIN) tablet Take 1 tablet by mouth daily.    Historical Provider, MD  predniSONE (DELTASONE) 5 MG tablet Take 5 mg by mouth daily.    Historical Provider, MD  QUEtiapine (SEROQUEL) 25 MG tablet Take 25 mg by mouth at bedtime.    Historical Provider, MD  sertraline (ZOLOFT) 100 MG tablet Take 100 mg by mouth at bedtime.    Historical Provider, MD  thiamine (VITAMIN B-1) 100 MG tablet Take 100 mg by mouth daily.    Historical Provider, MD  vancomycin (VANCOCIN) 50 mg/mL oral solution Take 5 mLs (250 mg total) by mouth every 6 (six) hours. 02/27/15   Fritzi Mandes, MD  vitamin B-12  (CYANOCOBALAMIN) 1000 MCG tablet Take 1,000 mcg by mouth daily.    Historical Provider, MD     Results for orders placed or performed during the hospital encounter of 10/03/15 (from the past 48 hour(s))  CBC with Differential     Status: Abnormal   Collection Time: 10/03/15  1:45 AM  Result Value Ref Range   WBC 7.1 3.6 - 11.0 K/uL   RBC 4.31 3.80 - 5.20 MIL/uL   Hemoglobin 10.8 (L) 12.0 - 16.0 g/dL   HCT 32.9 (L) 35.0 - 47.0 %   MCV 76.4 (L) 80.0 - 100.0 fL   MCH 25.0 (L) 26.0 - 34.0 pg   MCHC 32.7 32.0 - 36.0 g/dL   RDW 17.7 (H) 11.5 - 14.5 %   Platelets 325 150 - 440 K/uL   Neutrophils Relative % 60 %   Neutro Abs 4.3 1.4 - 6.5 K/uL   Lymphocytes Relative 26 %   Lymphs Abs 1.8 1.0 - 3.6 K/uL   Monocytes Relative 13 %   Monocytes Absolute 0.9 0.2 - 0.9 K/uL   Eosinophils Relative 1 %   Eosinophils Absolute 0.1 0 - 0.7 K/uL   Basophils Relative 0 %   Basophils Absolute 0.0 0 - 0.1 K/uL  Comprehensive metabolic panel     Status: Abnormal   Collection Time: 10/03/15  1:45 AM  Result Value Ref Range   Sodium 143 135 - 145 mmol/L   Potassium 3.1 (L) 3.5 - 5.1 mmol/L   Chloride 105 101 - 111 mmol/L   CO2 27 22 - 32 mmol/L   Glucose, Bld 100 (H) 65 - 99 mg/dL   BUN 10 6 - 20 mg/dL   Creatinine, Ser 0.97 0.44 - 1.00 mg/dL   Calcium 9.5 8.9 - 10.3 mg/dL   Total Protein 7.6 6.5 - 8.1 g/dL   Albumin 3.3 (L) 3.5 - 5.0 g/dL   AST 15 15 - 41 U/L   ALT 8 (L) 14 - 54 U/L   Alkaline Phosphatase 67 38 - 126 U/L   Total Bilirubin 0.5 0.3 - 1.2 mg/dL   GFR calc non Af Amer >60 >60 mL/min   GFR calc Af Amer >60 >60 mL/min    Comment: (NOTE) The eGFR has been calculated using the CKD EPI equation. This calculation has not been validated in all clinical situations. eGFR's persistently <60 mL/min signify possible Chronic Kidney Disease.    Anion gap 11 5 - 15  Ethanol     Status: None   Collection Time: 10/03/15  1:45 AM  Result Value Ref Range   Alcohol, Ethyl (B) <5 <5 mg/dL     Comment:        LOWEST DETECTABLE LIMIT FOR SERUM ALCOHOL IS 5 mg/dL FOR  MEDICAL PURPOSES ONLY   Troponin I     Status: None   Collection Time: 10/03/15  1:45 AM  Result Value Ref Range   Troponin I <0.03 <0.031 ng/mL    Comment:        NO INDICATION OF MYOCARDIAL INJURY.   CK     Status: Abnormal   Collection Time: 10/03/15  1:45 AM  Result Value Ref Range   Total CK 32 (L) 38 - 234 U/L   Ct Head Wo Contrast  10/03/2015  CLINICAL DATA:  59 year old female with seizures and fall. EXAM: CT HEAD WITHOUT CONTRAST TECHNIQUE: Contiguous axial images were obtained from the base of the skull through the vertex without intravenous contrast. COMPARISON:  Brain MRI dated 07/03/2015 and head CT dated 11/20/2014 FINDINGS: The ventricles are dilated and the sulci are prominent compatible with age-related atrophy. Periventricular and deep white matter hypodensities represent chronic microvascular ischemic changes. Left cerebellar old infarct and encephalomalacia. Right parietal occipital infarct and encephalomalacia changes. There is no intracranial hemorrhage. No mass effect or midline shift identified. The visualized paranasal sinuses and mastoid air cells are well aerated. The calvarium is intact. IMPRESSION: No acute intracranial hemorrhage. Age-related atrophy and chronic microvascular ischemic disease. Large left cerebellar and right posterior parietal old infarcts and encephalomalacia. Electronically Signed   By: Anner Crete M.D.   On: 10/03/2015 03:31   Dg Chest Port 1 View  10/03/2015  CLINICAL DATA:  59 year old female with seizures and fall. Laceration to the right forearm. EXAM: PORTABLE CHEST 1 VIEW COMPARISON:  Chest radiograph dated 12/14/2014 FINDINGS: Single-view of the chest does not demonstrate any focal consolidation. No pleural effusion or pneumothorax. Stable cardiac silhouette. Mechanical cardiac valve and median sternotomy wires noted. The osseous structures are grossly  unremarkable. IMPRESSION: No active disease. Electronically Signed   By: Anner Crete M.D.   On: 10/03/2015 03:10    Review of Systems  Unable to perform ROS: mental status change    Blood pressure 146/87, pulse 93, temperature 97.8 F (36.6 C), temperature source Oral, resp. rate 18, height _0  (1.549 m), weight 49.896 kg (110 lb), SpO2 100 %. Physical Exam  Vitals reviewed. Constitutional: She appears well-developed and well-nourished. No distress.  HENT:  Head: Normocephalic and atraumatic.  Mouth/Throat: Oropharynx is clear and moist.  Eyes: Conjunctivae and EOM are normal. Pupils are equal, round, and reactive to light. No scleral icterus.  Neck: Normal range of motion. Neck supple. No JVD present. No tracheal deviation present. No thyromegaly present.  Cardiovascular: Normal rate, regular rhythm and normal heart sounds.  Exam reveals no gallop and no friction rub.   No murmur heard. Respiratory: Effort normal and breath sounds normal.  GI: Soft. Bowel sounds are normal. She exhibits no distension. There is no tenderness.  Genitourinary:  Deferred  Musculoskeletal: Normal range of motion. She exhibits no edema.  Lymphadenopathy:    She has no cervical adenopathy.  Neurological: No cranial nerve deficit (Uncooperative with exam). She exhibits normal muscle tone.  Skin: Skin is warm and dry. No rash noted. No erythema.  Psychiatric: She has a normal mood and affect. Her behavior is normal. Judgment and thought content normal.     Assessment/Plan This is a 59 year old Afro-American female admitted for encephalopathy and seizure disorder. 1. Encephalopathy: Likely post ictal although could be metabolic as well given history of alcohol abuse. The patient was given 1 L of normal saline in the emergency department. I will replete him thiamine and folic acid with  a vitamin bag. She was also given oral potassium in the emergency department which I will continue to replete with  maintenance fluid. Continue to monitor mental status. Continue donepezil for encephalomalacia related dementia 2. Seizure disorder: Possibly secondary to call withdrawal. The patient also has a history of stroke and has old parietal infarcts. Continue Keppra 3. Alcohol abuse: CIWA scale and benzodiazepines as necessary 4. Urinary artery disease: Stable; no indication of myocardial ischemia. Continue Plavix  5. C. difficile colitis: Continue acidophilus supplementation as well as oral vancomycin 6. Lipidemia: Continue Lipitor for cerebrovascular protection 7. Mood disorder: Continue sertraline and Seroquel 8. DVT prophylaxis: Heparin 9. GI prophylaxis: H2 blocker per home regimen (presumedly to decrease risk of aspiration) The patient is a full code. Time spent on admission orders and patient care approximately 35 minutes  Harrie Foreman 10/03/2015, 5:43 AM

## 2015-10-03 NOTE — ED Notes (Signed)
Patient transported to CT 

## 2015-10-03 NOTE — ED Provider Notes (Signed)
[facility] Emergency Department Provider Note  ____________________________________________  Time seen: Approximately 1:09 AM  I have reviewed the triage vital signs and the nursing notes.   HISTORY  Chief Complaint Fall  Limited by postictal state.  HPI [name] is a 59 y.o. female who presents to the ED from home via EMS for a chief complaint of fall. States she was getting up to get her TV remote, her left leg "gave out" and she fell. Denies striking head or LOC. Complains of "shaking" to her left leg and laceration to right arm. Denies recent travel or trauma. Denies recent fever, chills, chest pain, shortness of breath, nausea, vomiting, diarrhea.   Past Medical History  Diagnosis Date  . ETOH abuse   . CVA (cerebral infarction)   . Acute CHF (congestive heart failure) ([place])   . Stress-induced cardiomyopathy   . Clostridium difficile colitis   . Seizures ([place])   . Hyperlipidemia   . Cancer ([place])   . Cervical cancer ([place])   . Throat cancer [facility])     Patient Active Problem List   Diagnosis Date Noted  . Colitis 02/25/2015  . Cerebral infarction ([place]) 12/07/2014  . Dysphagia   . Acute respiratory failure with hypoxia ([place]) 11/29/2014  . Encounter for intubation 11/29/2014    Past Surgical History  Procedure Laterality Date  . Coronary artery bypass graft    . Colonoscopy with propofol N/A 04/23/2015    Procedure: COLONOSCOPY WITH PROPOFOL;  Surgeon: Manya Silvas, MD;  Location: [facility] ENDOSCOPY;  Service: Endoscopy;  Laterality: N/A;  . 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 acidophilus (RISAQUAD) CAPS capsule   Oral   Take 2 capsules by mouth 2 (two) times daily with a meal.   40 capsule   0   . atorvastatin (LIPITOR) 10 MG tablet   Oral  Take 10 mg by mouth at bedtime.         . clonazePAM (KLONOPIN) 0.5 MG tablet   Oral   Take 0.5 mg by mouth 3 (three) times daily.         . clopidogrel (PLAVIX) 75 MG tablet   Oral   Take 75 mg by mouth daily.         Marland Kitchen donepezil (ARICEPT) 5 MG tablet   Oral   Take 5 mg by mouth at bedtime.         . famotidine (PEPCID) 20 MG tablet   Oral   Take 20 mg by mouth 2 (two) times daily.         . feeding supplement, ENSURE ENLIVE, (ENSURE ENLIVE) LIQD   Oral   Take 237 mLs by mouth 3 (three) times daily with meals.   237 mL   12   . fentaNYL (DURAGESIC - DOSED MCG/HR) 25 MCG/HR patch   Transdermal   Place 25 mcg onto the skin every 3 (three) days.         . folic acid (FOLVITE) A999333 MCG tablet   Oral   Take 800 mcg by mouth daily.         . furosemide (LASIX) 40 MG tablet   Oral   Take 0.5 tablets (20 mg total) by mouth daily.   30 tablet   0   . levETIRAcetam (  KEPPRA) 500 MG tablet   Oral   Take 500 mg by mouth 2 (two) times daily.         . magnesium oxide (MAG-OX) 400 MG tablet   Oral   Take 400 mg by mouth daily.         . Multiple Vitamin (MULTIVITAMIN) tablet   Oral   Take 1 tablet by mouth daily.         . predniSONE (DELTASONE) 5 MG tablet   Oral   Take 5 mg by mouth daily.         . QUEtiapine (SEROQUEL) 25 MG tablet   Oral   Take 25 mg by mouth at bedtime.         . sertraline (ZOLOFT) 100 MG tablet   Oral   Take 100 mg by mouth at bedtime.         . thiamine (VITAMIN B-1) 100 MG tablet   Oral   Take 100 mg by mouth daily.         . vancomycin (VANCOCIN) 50 mg/mL oral solution   Oral   Take 5 mLs (250 mg total) by mouth every 6 (six) hours.   240 mL   0   . vitamin B-12 (CYANOCOBALAMIN) 1000 MCG tablet   Oral   Take 1,000 mcg by mouth daily.           Allergies Geodon and Haldol  Family History  Problem Relation Age of Onset  . Diabetes Mother   . Lung cancer Father     Social History Social  History  Substance Use Topics  . Smoking status: Current Every Day Smoker -- 1.00 packs/day    Types: Cigarettes  . Smokeless tobacco: None  . Alcohol Use: Yes     Comment: twice a month    Review of Systems Constitutional: No fever/chills Eyes: No visual changes. ENT: No sore throat. Cardiovascular: Denies chest pain. Respiratory: Denies shortness of breath. Gastrointestinal: No abdominal pain.  No nausea, no vomiting.  No diarrhea.  No constipation. Genitourinary: Negative for dysuria. Musculoskeletal: Positive for right arm laceration. Negative for back pain. Skin: Negative for rash. Neurological: Negative for headaches, focal weakness or numbness.  10-point ROS otherwise negative.  ____________________________________________   PHYSICAL EXAM:  VITAL SIGNS: ED Triage Vitals  Enc Vitals Group     BP 10/03/15 0052 138/91 mmHg     Pulse Rate 10/03/15 0052 90     Resp 10/03/15 0052 18     Temp 10/03/15 0052 97.8 F (36.6 C)     Temp Source 10/03/15 0052 Oral     SpO2 10/03/15 0052 96 %     Weight 10/03/15 0052 110 lb (49.896 kg)     Height 10/03/15 0052 5\' 1"  (1.549 m)     Head Cir --      Peak Flow --      Pain Score --      Pain Loc --      Pain Edu? --      Excl. in [place]? --     Constitutional: Actively seizing. Eyes: Conjunctivae are normal. PERRL. EOMI. Head: Atraumatic. Nose: No congestion/rhinnorhea. Mouth/Throat: Mucous membranes are moist.  Oropharynx non-erythematous. Neck: No stridor.  No cervical spine tenderness to palpation. Cardiovascular: Tachycardic rate, regular rhythm. Grossly normal heart sounds.  Good peripheral circulation. Respiratory: Increased respiratory effort.  No retractions. Lungs scattered rhonchi. Gastrointestinal: Soft and nontender. No distention. No abdominal bruits. No CVA tenderness. Musculoskeletal: Small skin tear to right  forearm. No lower extremity tenderness nor edema.  No joint effusions. Neurologic:  Tonic-clonic  seizure. Skin:  Skin is warm, dry and intact. No rash noted. Psychiatric: Unable to assess secondary to active seizure..  ____________________________________________   LABS (all labs ordered are listed, but only abnormal results are displayed)  Labs Reviewed  CBC WITH DIFFERENTIAL/PLATELET - Abnormal; Notable for the following:    Hemoglobin 10.8 (*)    HCT 32.9 (*)    MCV 76.4 (*)    MCH 25.0 (*)    RDW 17.7 (*)    All other components within normal limits  COMPREHENSIVE METABOLIC PANEL - Abnormal; Notable for the following:    Potassium 3.1 (*)    Glucose, Bld 100 (*)    Albumin 3.3 (*)    ALT 8 (*)    All other components within normal limits  CK - Abnormal; Notable for the following:    Total CK 32 (*)    All other components within normal limits  ETHANOL  TROPONIN I  URINALYSIS COMPLETEWITH MICROSCOPIC ([facility] ONLY)  URINE DRUG SCREEN, QUALITATIVE ([facility] ONLY)   ____________________________________________  EKG  ED ECG REPORT I, Jeanette Moffatt J, the attending physician, personally viewed and interpreted this ECG.   Date: 10/03/2015  EKG Time: 0207  Rate: 99  Rhythm: normal EKG, normal sinus rhythm  Axis: Normal  Intervals:none  ST&T Change: Nonspecific  ____________________________________________  RADIOLOGY  CT head without contrast interpreted per Dr. Quintella Reichert: No acute intracranial hemorrhage.  Age-related atrophy and chronic microvascular ischemic disease. Large left cerebellar and right posterior parietal old infarcts and encephalomalacia.  Portable chest x-ray (viewed by me, interpreted per Dr. Quintella Reichert): No active disease. ____________________________________________   PROCEDURES  Procedure(s) performed: None  Critical Care performed: Yes, see critical care note(s)  CRITICAL CARE Performed by: Paulette Blanch   Total critical care time: 30 minutes  Critical care time was exclusive of separately billable procedures and treating other  patients.  Critical care was necessary to treat or prevent imminent or life-threatening deterioration.  Critical care was time spent personally by me on the following activities: development of treatment plan with patient and/or surrogate as well as nursing, discussions with consultants, evaluation of patient's response to treatment, examination of patient, obtaining history from patient or surrogate, ordering and performing treatments and interventions, ordering and review of laboratory studies, ordering and review of radiographic studies, pulse oximetry and re-evaluation of patient's condition. ____________________________________________   INITIAL IMPRESSION / ASSESSMENT AND PLAN / ED COURSE  Pertinent labs & imaging results that were available during my care of the patient were reviewed by me and considered in my medical decision making (see chart for details).  59 year old female with seizure disorder on Keppra who presents s/p mechanical fall. Initially complained to nurse of left leg "twitching" and right arm laceration. I was called to the bedside for patient having an active tonic clonic seizure. Seizure lasted approximately 2 minutes and resolved prior to administration of IM Ativan. Patient had a brief postictal state and was shortly able to follow commands. Given her history of seizure, alcohol abuse, will obtain screening lab work including EtOH, CT head to evaluate for injury or hemorrhage; administer IV Keppra and reassess.  ----------------------------------------- 2:09 AM on 10/03/2015 -----------------------------------------  Patient resting in no acute distress. She is now speaking with clear speech. Focused neurological exam performed. Patient is alert and oriented 3. Left lower extremity 4/5 motor strength compared to right lower extremity. Patient states this is new for her. States she  did not take her seizure medicine today. States she is not a daily drinker; last drink  was last weekend. Denies history of DTs.  ----------------------------------------- 3:40 AM on 10/03/2015 -----------------------------------------  Patient found on floor. States she tried to get out of bed to find more covers. Denies striking head or LOC. External exam does not reveal any new injuries. Discussed with hospitalist to evaluate patient in the ED for admission. ____________________________________________   FINAL CLINICAL IMPRESSION(S) / ED DIAGNOSES  Final diagnoses:  Seizure ([place])  Encephalopathy  Fall, initial encounter  Skin tear of forearm without complication, right, initial encounter  Hypokalemia      Paulette Blanch, MD 10/03/15 [phone]

## 2015-10-03 NOTE — Evaluation (Signed)
Physical Therapy Evaluation Patient Details Name: [name] MRN: [id number] DOB: [birth year] Today's Date: 10/03/2015   History of Present Illness  Pt is a 59 y.o. female presenting to hospital s/p fall (legs gave out standing to reach TV remote).  In ED pt with global tonic clonic seizure.  Pt admitted with encephalopathy and seizure disorder.  CT of head negative for acute infarct (shows old infarcts L cerebellar and R posterior parietal).  PMH includes G-tube, ETOH abuse, CVA, acute CHF, c-diff, cervical and throat CA, and R hip surgery June 2015.  Clinical Impression  Prior to admission, pt was ambulating independently without AD.  Pt lives with her niece in 1 level home with steps to enter.  Currently pt is 2 assist with transfers bed to/from commode; unable to facilitate pt ambulating further distance with 2 assist d/t impulsiveness, difficulty following directions, and posterior lean.  Pt attempting multiple times during session to lay down in bed d/t wanting to go to sleep.  Nursing reports pt was able to transfer to commode with 1 assist earlier in AM.  Pt would benefit from skilled PT to address noted impairments and functional limitations. Pt does not appear safe to discharge home at this time d/t significant assist levels required.  Recommend pt discharge to STR when medically appropriate.     Follow Up Recommendations SNF    Equipment Recommendations   (TBD)    Recommendations for Other Services       Precautions / Restrictions Precautions Precautions: Fall Precaution Comments: G-tube Restrictions Weight Bearing Restrictions: No      Mobility  Bed Mobility Overal bed mobility: Needs Assistance Bed Mobility: Sit to Supine       Sit to supine: Min assist;Mod assist;HOB elevated   General bed mobility comments: assist for safety d/t impulsiveness  Transfers Overall transfer level: Needs assistance Equipment used: Rolling walker (2 wheeled);None Transfers: Sit  to/from [facility] Sit to Stand: Mod assist;+2 physical assistance;+2 safety/equipment (x5 trials (from bed and commode)) Stand pivot transfers: Mod assist;Max assist;+2 physical assistance;+2 safety/equipment (bed to/from commode)       General transfer comment: pt with significant difficulty following commands and required significant extra time and assist for safe transfer; significant posterior lean; pt crossing LE's and having difficulty with maintaining hand placement (with or without RW)  Ambulation/Gait Ambulation/Gait assistance: Max assist;+2 physical assistance;+2 safety/equipment           General Gait Details: pt able to take a few steps with significant assist bed to/from commmode; attempted ambulation multiple times but unable to get pt to safely take any steps with 2 assist  Stairs            Wheelchair Mobility    Modified Rankin (Stroke Patients Only)       Balance Overall balance assessment: Needs assistance Sitting-balance support: Bilateral upper extremity supported;Feet supported Sitting balance-Leahy Scale: Poor Sitting balance - Comments: Pt SBA to mod assist (d/t pt trying to lay down intermittently)   Standing balance support: Bilateral upper extremity supported (on RW) Standing balance-Leahy Scale: Zero Standing balance comment: significant posterior lean                             Pertinent Vitals/Pain Pain Assessment: No/denies pain  Vitals stable and WFL throughout treatment session.    Home Living Family/patient expects to be discharged to:: Private residence Living Arrangements: Other relatives (Niece) Available Help at Discharge: Family  Home Access: Stairs to enter Entrance Stairs-Rails: None Entrance Stairs-Number of Steps: 2 Home Layout: One level Home Equipment: Walker - 2 wheels      Prior Function Level of Independence: Independent         Comments: Pt has been ambulating without AD  for past 2 months (prior to that had RW and HHPT).  Pt stays with her niece.     Hand Dominance        Extremity/Trunk Assessment   Upper Extremity Assessment: Overall WFL for tasks assessed (pt appearing strong in UE's with activity but did not follow commands to formally test with MMT)           Lower Extremity Assessment: Overall WFL for tasks assessed (pt appearing strong in LE's with activity but did not follow commands to formally test with MMT)         Communication   Communication: No difficulties  Cognition Arousal/Alertness: Awake/alert (pt wanting to go to sleep and intermittently needing cueing to open her eyes) Behavior During Therapy: Impulsive;Restless Overall Cognitive Status: Impaired/Different from baseline (pt had difficulty following one step commands consistently; pt oriented to person, place, time (holiday and 2016))                      General Comments General comments (skin integrity, edema, etc.): skin laceration R arm (near elbow)  Nursing cleared pt for participation in physical therapy.  Pt agreeable to PT session. Nursing presenting during most of session and pt was sitting on edge of bed with nursing when PT arrived.    Exercises        Assessment/Plan    PT Assessment Patient needs continued PT services  PT Diagnosis Difficulty walking   PT Problem List Decreased balance;Decreased mobility;Decreased cognition;Decreased safety awareness  PT Treatment Interventions DME instruction;Gait training;Stair training;Functional mobility training;Therapeutic activities;Therapeutic exercise;Balance training;Patient/family education;Manual techniques   PT Goals (Current goals can be found in the Care Plan section) Acute Rehab PT Goals Patient Stated Goal: to improve independence with functional mobility PT Goal Formulation: With family Time For Goal Achievement: 10/17/15 Potential to Achieve Goals: Fair    Frequency Min 2X/week    Barriers to discharge Decreased caregiver support      Co-evaluation               End of Session Equipment Utilized During Treatment: Gait belt Activity Tolerance:  (Limited d/t pt wanting to lay down) Patient left: in bed;with call bell/phone within reach;with bed alarm set;with family/visitor present Nurse Communication: Mobility status;Precautions    Functional Assessment Tool Used: AM-PAC without stairs Functional Limitation: Mobility: Walking and moving around Mobility: Walking and Moving Around Current Status [id number]): At least 80 percent but less than 100 percent impaired, limited or restricted Mobility: Walking and Moving Around Goal Status [phone]): At least 1 percent but less than 20 percent impaired, limited or restricted    Time: 1055-1130 PT Time Calculation (min) (ACUTE ONLY): 35 min   Charges:   PT Evaluation $Initial PT Evaluation Tier I: 1 Procedure PT Treatments $Therapeutic Activity: 8-22 mins   PT G Codes:   PT G-Codes **NOT FOR INPATIENT CLASS** Functional Assessment Tool Used: AM-PAC without stairs Functional Limitation: Mobility: Walking and moving around Mobility: Walking and Moving Around Current Status [id number]): At least 80 percent but less than 100 percent impaired, limited or restricted Mobility: Walking and Moving Around Goal Status [phone]): At least 1 percent but less than 20 percent impaired, limited or restricted  Raquel Sarna Caidyn Henricksen 10/03/2015, 1:09 PM Leitha Bleak, [place]

## 2015-10-03 NOTE — Care Management Obs Status (Signed)
[place] NOTIFICATION   Patient Details  Name: [name] MRN: [id number] Date of Birth: 24-Sep-1956   Medicare Observation Status Notification Given:  Yes    Alvie Heidelberg, RN 10/03/2015, 3:03 PM

## 2015-10-03 NOTE — Progress Notes (Signed)
Notified of patient fall at 1555. Nurse Gillermina Hu found patient on floor in room. Pt was in bed with bed alarm on. Pt ad no complaints of pain. VSS. MD notified of fall. MD will place order for sitter and PRN ativan for anxiety. Nurse Ledell Noss and Nurse Mills Koller had repositioned patient multiple times throughout the day as patient did not want to stay up at the head of the bed. Nurse tech had just left room from repositioning patient a few minutes prior to fall. Unable to get in touch with family to notify them of fall due to number listed on chart being disconnected. Patient is not able to give another number to get in touch with family.

## 2015-10-03 NOTE — ED Notes (Signed)
Pt complaining of left leg shaking. Pt's eyes began twitching and whole body noted to be shaking. Pt not responding. MD and Modena Nunnery, RN to bedside to assist. Pt given Ativan 2mg  IM and placed on oxygen. Attempting to establish IV access at this time.

## 2015-10-03 NOTE — Progress Notes (Signed)
Notified Dr. Margaretmary Eddy that patient did not do well with physical therapy. Also per Dr. Margaretmary Eddy discontinue order for fentanyl patch.

## 2015-10-03 NOTE — ED Notes (Signed)
Patient soiled bed despite brief that is in place. Incontinent of bowel and bladder. Patient cleaned and linens changed. Pending admission.

## 2015-10-03 NOTE — ED Notes (Signed)
This RN out of room with new EMS patient. Charge nurse with reports of patient being found in the floor next to bed on fall mats. No injuries sustained. This RN to bedside. Patient observed in bed with NAD noted. Patient reports that nothing hurts and that she is "just fine". No anticipated needs identified at this time. Patient with seizure pads and fall mats in place. Will continue to monitor.

## 2015-10-03 NOTE — ED Notes (Addendum)
Pt states she was getting up to look for remote and left leg gave out and she fell. Pt complains of shaking to left leg. Pt denies pain at this time. Pt has laceration to right forearm, bleeding controlled. Pt states she hit it on end table. EMS states pt is from home and husband was present at time of fall.

## 2015-10-03 NOTE — Progress Notes (Signed)
Sister Lelon Frohlich came to visit patient and RN notified her that patient had had a fall. RN attempted to call family but number had been disconnected.

## 2015-10-04 DIAGNOSIS — Z79899 Other long term (current) drug therapy: Secondary | ICD-10-CM | POA: Diagnosis not present

## 2015-10-04 DIAGNOSIS — G9349 Other encephalopathy: Secondary | ICD-10-CM | POA: Diagnosis present

## 2015-10-04 DIAGNOSIS — Z7902 Long term (current) use of antithrombotics/antiplatelets: Secondary | ICD-10-CM | POA: Diagnosis not present

## 2015-10-04 DIAGNOSIS — Z8541 Personal history of malignant neoplasm of cervix uteri: Secondary | ICD-10-CM | POA: Diagnosis not present

## 2015-10-04 DIAGNOSIS — E785 Hyperlipidemia, unspecified: Secondary | ICD-10-CM | POA: Diagnosis present

## 2015-10-04 DIAGNOSIS — E876 Hypokalemia: Secondary | ICD-10-CM | POA: Diagnosis present

## 2015-10-04 DIAGNOSIS — S51811A Laceration without foreign body of right forearm, initial encounter: Secondary | ICD-10-CM | POA: Diagnosis present

## 2015-10-04 DIAGNOSIS — Z85819 Personal history of malignant neoplasm of unspecified site of lip, oral cavity, and pharynx: Secondary | ICD-10-CM | POA: Diagnosis not present

## 2015-10-04 DIAGNOSIS — Z8673 Personal history of transient ischemic attack (TIA), and cerebral infarction without residual deficits: Secondary | ICD-10-CM | POA: Diagnosis not present

## 2015-10-04 DIAGNOSIS — F17210 Nicotine dependence, cigarettes, uncomplicated: Secondary | ICD-10-CM | POA: Diagnosis present

## 2015-10-04 DIAGNOSIS — W01190A Fall on same level from slipping, tripping and stumbling with subsequent striking against furniture, initial encounter: Secondary | ICD-10-CM | POA: Diagnosis present

## 2015-10-04 DIAGNOSIS — G9340 Encephalopathy, unspecified: Secondary | ICD-10-CM | POA: Diagnosis present

## 2015-10-04 DIAGNOSIS — Z7952 Long term (current) use of systemic steroids: Secondary | ICD-10-CM | POA: Diagnosis not present

## 2015-10-04 DIAGNOSIS — Y92009 Unspecified place in unspecified non-institutional (private) residence as the place of occurrence of the external cause: Secondary | ICD-10-CM | POA: Diagnosis not present

## 2015-10-04 DIAGNOSIS — G40909 Epilepsy, unspecified, not intractable, without status epilepticus: Secondary | ICD-10-CM | POA: Diagnosis present

## 2015-10-04 DIAGNOSIS — F39 Unspecified mood [affective] disorder: Secondary | ICD-10-CM | POA: Diagnosis present

## 2015-10-04 DIAGNOSIS — R000 Tachycardia, unspecified: Secondary | ICD-10-CM | POA: Diagnosis present

## 2015-10-04 DIAGNOSIS — T426X6A Underdosing of other antiepileptic and sedative-hypnotic drugs, initial encounter: Secondary | ICD-10-CM | POA: Diagnosis present

## 2015-10-04 DIAGNOSIS — I2510 Atherosclerotic heart disease of native coronary artery without angina pectoris: Secondary | ICD-10-CM | POA: Diagnosis present

## 2015-10-04 LAB — CBC
HEMATOCRIT: 34.3 % — AB (ref 35.0–47.0)
HEMOGLOBIN: 10.8 g/dL — AB (ref 12.0–16.0)
MCH: 24.3 pg — AB (ref 26.0–34.0)
MCHC: 31.6 g/dL — ABNORMAL LOW (ref 32.0–36.0)
MCV: 77 fL — AB (ref 80.0–100.0)
Platelets: 336 10*3/uL (ref 150–440)
RBC: 4.45 MIL/uL (ref 3.80–5.20)
RDW: 17.5 % — ABNORMAL HIGH (ref 11.5–14.5)
WBC: 5.6 10*3/uL (ref 3.6–11.0)

## 2015-10-04 LAB — COMPREHENSIVE METABOLIC PANEL
ALT: 7 U/L — ABNORMAL LOW (ref 14–54)
ANION GAP: 8 (ref 5–15)
AST: 18 U/L (ref 15–41)
Albumin: 3.2 g/dL — ABNORMAL LOW (ref 3.5–5.0)
Alkaline Phosphatase: 65 U/L (ref 38–126)
BUN: 8 mg/dL (ref 6–20)
CHLORIDE: 104 mmol/L (ref 101–111)
CO2: 25 mmol/L (ref 22–32)
Calcium: 9.3 mg/dL (ref 8.9–10.3)
Creatinine, Ser: 0.84 mg/dL (ref 0.44–1.00)
GFR calc non Af Amer: >60 mL/min (ref >60)
Glucose, Bld: 68 mg/dL (ref 65–99)
POTASSIUM: 3.5 mmol/L (ref 3.5–5.1)
SODIUM: 137 mmol/L (ref 135–145)
Total Bilirubin: 0.7 mg/dL (ref 0.3–1.2)
Total Protein: 7.3 g/dL (ref 6.5–8.1)

## 2015-10-04 LAB — MAGNESIUM: MAGNESIUM: 1.7 mg/dL (ref 1.7–2.4)

## 2015-10-04 LAB — T4, FREE: FREE T4: 1.04 ng/dL (ref 0.61–1.12)

## 2015-10-04 MED ORDER — ENOXAPARIN SODIUM 40 MG/0.4ML ~~LOC~~ SOLN
40.0000 mg | SUBCUTANEOUS | Status: DC
  Administered 2015-10-05 (×2): 40 mg via SUBCUTANEOUS
  Filled 2015-10-04 (×2): qty 0.4

## 2015-10-04 MED ORDER — ADULT MULTIVITAMIN W/MINERALS CH
1.0000 | ORAL_TABLET | Freq: Every day | ORAL | Status: DC
  Administered 2015-10-04 – 2015-10-06 (×3): 1 via ORAL
  Filled 2015-10-04 (×3): qty 1

## 2015-10-04 MED ORDER — VITAMIN B-1 100 MG PO TABS
100.0000 mg | ORAL_TABLET | Freq: Every day | ORAL | Status: DC
  Administered 2015-10-04 – 2015-10-06 (×3): 100 mg via ORAL
  Filled 2015-10-04 (×3): qty 1

## 2015-10-04 MED ORDER — LORAZEPAM 1 MG PO TABS
1.0000 mg | ORAL_TABLET | Freq: Four times a day (QID) | ORAL | Status: DC | PRN
  Administered 2015-10-05: 1 mg via ORAL
  Filled 2015-10-04: qty 1

## 2015-10-04 MED ORDER — FOLIC ACID 1 MG PO TABS
1.0000 mg | ORAL_TABLET | Freq: Every day | ORAL | Status: DC
  Administered 2015-10-04 – 2015-10-06 (×3): 1 mg via ORAL
  Filled 2015-10-04 (×3): qty 1

## 2015-10-04 MED ORDER — THIAMINE HCL 100 MG/ML IJ SOLN: 100.0000 mg | Freq: Every day | INTRAMUSCULAR | Status: DC

## 2015-10-04 MED ORDER — LORAZEPAM 2 MG/ML IJ SOLN
1.0000 mg | Freq: Four times a day (QID) | INTRAMUSCULAR | Status: DC | PRN
  Administered 2015-10-04 – 2015-10-05 (×2): 1 mg via INTRAVENOUS
  Filled 2015-10-04 (×2): qty 1

## 2015-10-04 NOTE — Progress Notes (Signed)
[facility] [place] NAME: [name]    MR#:  [id number]  DATE OF BIRTH:  [birth year]  SUBJECTIVE:  CHIEF COMPLAINT:  Patient is arousable but falling asleep. Not completely awake and alert.  REVIEW OF SYSTEMS:  Review of system is unobtainable secondary to lethargy  DRUG ALLERGIES:   Allergies  Allergen Reactions  . Geodon [Ziprasidone Hcl] Other (See Comments)    Unknown reaction  . Haldol [Haloperidol] Other (See Comments)    Unknown reaction    VITALS:  Blood pressure 100/62, pulse 102, temperature 97.9 F (36.6 C), temperature source Oral, resp. rate 18, height 5\' 1"  (1.549 m), weight 45.949 kg (101 lb 4.8 oz), SpO2 98 %.  PHYSICAL EXAMINATION:  GENERAL:  59 y.o.-year-old patient lying in the bed with no acute distress.  EYES: Pupils equal, round, reactive to light and accommodation. No scleral icterus.   HEENT: Head atraumatic, normocephalic. Oropharynx and nasopharynx clear.  NECK:  Supple, no jugular venous distention. No thyroid enlargement, no tenderness.  LUNGS: Normal breath sounds bilaterally, no wheezing, rales,rhonchi or crepitation. No use of accessory muscles of respiration.  CARDIOVASCULAR: S1, S2 normal. No murmurs, rubs, or gallops.  ABDOMEN: Soft, nontender, nondistended. Bowel sounds present. No organomegaly or mass.  EXTREMITIES: No pedal edema, cyanosis, or clubbing.  NEUROLOGIC: altered mentation PSYCHIATRIC: The patient is With altered mental status SKIN: No obvious rash, lesion, or ulcer.    LABORATORY PANEL:   CBC  Recent Labs Lab 10/04/15 0527  WBC 5.6  HGB 10.8*  HCT 34.3*  PLT 336   ------------------------------------------------------------------------------------------------------------------  Chemistries   Recent Labs Lab 10/04/15 0527  NA 137  K 3.5  CL 104  CO2 25  GLUCOSE 68  BUN 8  CREATININE 0.84  CALCIUM 9.3  MG 1.7  AST 18  ALT 7*  ALKPHOS 65  BILITOT  0.7   ------------------------------------------------------------------------------------------------------------------  Cardiac Enzymes  Recent Labs Lab 10/03/15 0145  TROPONINI <0.03   ------------------------------------------------------------------------------------------------------------------  RADIOLOGY:  Ct Head Wo Contrast  10/03/2015  CLINICAL DATA:  59 year old female with seizures and fall. EXAM: CT HEAD WITHOUT CONTRAST TECHNIQUE: Contiguous axial images were obtained from the base of the skull through the vertex without intravenous contrast. COMPARISON:  Brain MRI dated 07/03/2015 and head CT dated 11/20/2014 FINDINGS: The ventricles are dilated and the sulci are prominent compatible with age-related atrophy. Periventricular and deep white matter hypodensities represent chronic microvascular ischemic changes. Left cerebellar old infarct and encephalomalacia. Right parietal occipital infarct and encephalomalacia changes. There is no intracranial hemorrhage. No mass effect or midline shift identified. The visualized paranasal sinuses and mastoid air cells are well aerated. The calvarium is intact. IMPRESSION: No acute intracranial hemorrhage. Age-related atrophy and chronic microvascular ischemic disease. Large left cerebellar and right posterior parietal old infarcts and encephalomalacia. Electronically Signed   By: Anner Crete M.D.   On: 10/03/2015 03:31   Dg Chest Port 1 View  10/03/2015  CLINICAL DATA:  59 year old female with seizures and fall. Laceration to the right forearm. EXAM: PORTABLE CHEST 1 VIEW COMPARISON:  Chest radiograph dated 12/14/2014 FINDINGS: Single-view of the chest does not demonstrate any focal consolidation. No pleural effusion or pneumothorax. Stable cardiac silhouette. Mechanical cardiac valve and median sternotomy wires noted. The osseous structures are grossly unremarkable. IMPRESSION: No active disease. Electronically Signed   By: Anner Crete M.D.   On: 10/03/2015 03:10    EKG:   Orders placed or performed during the hospital encounter of  02/25/15  . ED EKG  . ED EKG  . EKG 12-Lead  . EKG 12-Lead  . EKG    ASSESSMENT AND PLAN:  This is a 59 year old Afro-American female admitted for encephalopathy and seizure disorder. 1. Encephalopathy: Likely post ictal seizure  I have discussed with her niece who states that patient has not been drinking she was living with them until this Sunday when she dropped her off at her boyfriend's house. Apparently patient was not taking her seizure medication. CT of the head negative for acute stroke shows an old stroke  2. Seizure disorder:  Continue Keppra   3. Alcohol abuse: History of drinking but according to the niece none now  4. Coronary artery disease: Stable;. Continue Plavix   5.history of C. difficile colitis in June 2016: Patient with no diarrhea during this admission per medical staff  Continue acidophilus supplementation  6. Lipidemia: Continue Lipitor for cerebrovascular protection  7. Mood disorder: hold sertraline and Seroquel as patient is with altered mentation  8. DVT prophylaxis: Heparin  9. GI prophylaxis: H2 blocker per home regimen   10.elevated TSH-check free T 4 and T3  pending   All the records are reviewed and case discussed with Care Management/Social Workerr. Management plans discussed with the patient, family and they are in agreement.  CODE STATUS: fc  TOTAL TIME TAKING CARE OF THIS PATIENT: 35 minutes.   POSSIBLE D/C IN 2-3 DAYS, DEPENDING ON CLINICAL CONDITION.   Dustin Flock M.D on 10/04/2015 at 2:36 PM  Between 7am to 6pm - Pager - [phone] After 6pm go to [email or web address] - password [email or web address] [facility]  Office  [phone]  CC: Primary care physician; Juluis Pitch, MD

## 2015-10-04 NOTE — Progress Notes (Signed)
Pt was beginning to get more restless and fidgety. Pt was grabbing at imaginary objects and seeing imaginary objects. Pt was given PRN dose of Ativan. Sitter still at bedside.

## 2015-10-04 NOTE — NC FL2 (Signed)
[place] LEVEL OF CARE SCREENING TOOL     IDENTIFICATION  Patient Name: [name] Birthdate: September 17, 1956 Sex: female Admission Date (Current Location): 10/03/2015  [place] Number:  Engineering geologist and Address:  [facility] [address] [address], [place], [place] [address]      Provider Number: [id number]  Attending Physician Name and Address:  Dustin Flock, MD  Relative Name and Phone Number:       Current Level of Care: Hospital Recommended Level of Care: [place] Prior Approval Number:    Date Approved/Denied:   PASRR Number:    Discharge Plan: SNF    Current Diagnoses: Patient Active Problem List   Diagnosis Date Noted  . Encephalopathy 10/03/2015  . Colitis 02/25/2015  . Cerebral infarction ([place]) 12/07/2014  . Dysphagia   . Acute respiratory failure with hypoxia ([place]) 11/29/2014  . Encounter for intubation 11/29/2014    Orientation RESPIRATION BLADDER Height & Weight    Self  Normal Continent   101 lbs.  BEHAVIORAL SYMPTOMS/MOOD NEUROLOGICAL BOWEL NUTRITION STATUS    Convulsions/Seizures Continent Diet  AMBULATORY STATUS COMMUNICATION OF NEEDS Skin   Extensive Assist Verbally Bruising                       Personal Care Assistance Level of Assistance  Bathing, Dressing Bathing Assistance: Limited assistance   Dressing Assistance: Limited assistance     Functional Limitations Info             SPECIAL CARE FACTORS FREQUENCY  PT (By licensed PT)                    Contractures Contractures Info: Not present    Additional Factors Info  Allergies   Allergies Info: geodon, haldol           Current Medications (10/04/2015):  This is the current hospital active medication list Current Facility-Administered Medications  Medication Dose Route Frequency Provider Last Rate Last Dose  . acetaminophen (TYLENOL) tablet 650 mg  650 mg Oral Q6H PRN Harrie Foreman,  MD       Or  . acetaminophen (TYLENOL) suppository 650 mg  650 mg Rectal Q6H PRN Harrie Foreman, MD      . acidophilus (RISAQUAD) capsule 2 capsule  2 capsule Oral BID WC Harrie Foreman, MD   2 capsule at 10/04/15 0840  . atorvastatin (LIPITOR) tablet 10 mg  10 mg Oral QHS Harrie Foreman, MD   10 mg at 10/03/15 2155  . clonazePAM (KLONOPIN) tablet 0.5 mg  0.5 mg Oral TID Harrie Foreman, MD   0.5 mg at 10/04/15 1024  . clopidogrel (PLAVIX) tablet 75 mg  75 mg Oral Daily Harrie Foreman, MD   75 mg at 10/04/15 1024  . docusate sodium (COLACE) capsule 100 mg  100 mg Oral BID Harrie Foreman, MD   100 mg at 10/04/15 1024  . donepezil (ARICEPT) tablet 5 mg  5 mg Oral QHS Harrie Foreman, MD   5 mg at 10/03/15 2155  . famotidine (PEPCID) tablet 20 mg  20 mg Oral Daily Harrie Foreman, MD   20 mg at 10/04/15 1025  . feeding supplement (ENSURE ENLIVE) (ENSURE ENLIVE) liquid 237 mL  237 mL Oral TID WC Harrie Foreman, MD   237 mL at 10/04/15 0840  . folic acid (FOLVITE) tablet 1 mg  1 mg Oral Daily Dustin Flock, MD  1 mg at 10/04/15 1025  . furosemide (LASIX) tablet 20 mg  20 mg Oral Daily Harrie Foreman, MD   20 mg at 10/04/15 1025  . heparin injection 5,000 Units  5,000 Units Subcutaneous 3 times per day Harrie Foreman, MD   5,000 Units at 10/04/15 [phone]  . levETIRAcetam (KEPPRA) tablet 500 mg  500 mg Oral BID Harrie Foreman, MD   500 mg at 10/04/15 1024  . LORazepam (ATIVAN) tablet 1 mg  1 mg Oral Q6H PRN Dustin Flock, MD       Or  . LORazepam (ATIVAN) injection 1 mg  1 mg Intravenous Q6H PRN Dustin Flock, MD   1 mg at 10/04/15 1039  . magnesium oxide (MAG-OX) tablet 400 mg  400 mg Oral Daily Harrie Foreman, MD   400 mg at 10/04/15 1024  . morphine 2 MG/ML injection 1 mg  1 mg Intravenous Q4H PRN Harrie Foreman, MD      . multivitamin with minerals tablet 1 tablet  1 tablet Oral Daily Dustin Flock, MD   1 tablet at 10/04/15 1025  . ondansetron (ZOFRAN)  tablet 4 mg  4 mg Oral Q6H PRN Harrie Foreman, MD       Or  . ondansetron [facility]) injection 4 mg  4 mg Intravenous Q6H PRN Harrie Foreman, MD      . predniSONE (DELTASONE) tablet 5 mg  5 mg Oral Q breakfast Harrie Foreman, MD   5 mg at 10/04/15 0840  . sodium chloride 0.9 % injection 3 mL  3 mL Intravenous Q12H Harrie Foreman, MD   3 mL at 10/04/15 1025  . thiamine (VITAMIN B-1) tablet 100 mg  100 mg Oral Daily Dustin Flock, MD   100 mg at 10/04/15 1024   Or  . thiamine (B-1) injection 100 mg  100 mg Intravenous Daily Dustin Flock, MD      . vitamin B-12 (CYANOCOBALAMIN) tablet 1,000 mcg  1,000 mcg Oral Daily Harrie Foreman, MD   1,000 mcg at 10/04/15 1024     Discharge Medications: Please see discharge summary for a list of discharge medications.  Relevant Imaging Results:  Relevant Lab Results:   Additional Information SS: [id number]  Shela Leff, LCSW

## 2015-10-04 NOTE — Clinical Social Work Note (Signed)
Clinical Social Work Assessment  Patient Details  Name: [name] MRN: [id number] Date of Birth: Dec 29, 1955  Date of referral:  10/04/15               Reason for consult:  Facility Placement                Permission sought to share information with:   (patient confused and agitated at times) Permission granted to share information::     Name::        Agency::     Relationship::     Contact Information:     Housing/Transportation Living arrangements for the past 2 months:  [place] of Information:  Other (Comment Required) (neice: IT sales professional) Patient Interpreter Needed:  None Criminal Activity/Legal Involvement Pertinent to Current Situation/Hospitalization:  No - Comment as needed Significant Relationships:  Siblings, Other(Comment) (neice: [name]; sister: [name]) Lives with:  neice Do you feel safe going back to the place where you live?    Need for family participation in patient care:     Care giving concerns:  Patient lives with niece, [name].   Social Worker assessment / plan:  CSW is unable at this time to answer questions appropriately and is confused and agitated at times. CSW spoke with [name]: [phone] via phone this morning. [name] informed CSW that patient was in [facility] from May of this year until October. Patient at that time of [facility] ambulatory and oriented and capable of returning home. Patient's niece states patient has not had a drink of alcohol since this past May. [name] states that if possible, she would like to see patient go back to [facility] for short term rehab. CSW informed her that patient's sister would have to be discontinued here and she would have to be cooperative before a SNF would accept her. [name] verbalized understanding.   CSW asked [name] who Diamantina Providence was as [name] is listed as patient's contact. [name] stated that this was her mother and that I can give her a call at :  [phone] or [phone] to confirm this. CSW did contact Ms. Withers and she did state that she would defer decisions to her daughter regarding patient.   Bedsearch initiated. [facility] existing.  Employment status:  Disabled (Comment on whether or not currently receiving Disability) Insurance information:  Medicare PT Recommendations:  Not assessed at this time Information / Referral to community resources:     Patient/Family's Response to care:  Patient's niece is Patent attorney of CSW assistance.  Patient/Family's Understanding of and Emotional Response to Diagnosis, Current Treatment, and Prognosis:  Patient's niece is involved with patient's care and has a good understanding of patient's past history.  Emotional Assessment Appearance:  Appears older than stated age Attitude/Demeanor/Rapport:  Unable to Assess Affect (typically observed):  Unable to Assess Orientation:  Oriented to Self Alcohol / Substance use:  Not Applicable Psych involvement (Current and /or in the community):  No (Comment)  Discharge Needs  Concerns to be addressed:  Care Coordination Readmission within the last 30 days:  No Current discharge risk:  None Barriers to Discharge:  No Barriers Identified   Shela Leff, LCSW 10/04/2015, 10:44 AM

## 2015-10-04 NOTE — Clinical Social Work Note (Signed)
Patient has had bed offer from [facility] and can discharge there once patient no longer requires a sitter for 24 hours.  Shela Leff MSW,LCSW [phone]

## 2015-10-04 NOTE — Progress Notes (Signed)
Physical Therapy Treatment Patient Details Name: [name] MRN: [id number] DOB: [birth year] Today's Date: 10/04/2015    History of Present Illness Pt is a 59 y.o. female presenting to hospital s/p fall (legs gave out standing to reach TV remote).  In ED pt with global tonic clonic seizure.  Pt admitted with encephalopathy and seizure disorder.  CT of head negative for acute infarct (shows old infarcts L cerebellar and R posterior parietal).  PMH includes G-tube, ETOH abuse, CVA, acute CHF, c-diff, cervical and throat CA, and R hip surgery June 2015.    PT Comments    Pt is very impulsive today with poor command follow. She is agitated with staff and had just urinated all over bed upon arrival. Pt perform sit to stand with anywhere from CGA to modA+1 due to instability and impulsivity. Other times she is able to demonstrate fair balance in standing without UE support to change gown. Once back in bed pt is distracted by television and much calmer for bed exercises. Pt will benefit from skilled PT services to address deficits in strength, balance, and mobility in order to return to full function at home.    Follow Up Recommendations  SNF     Equipment Recommendations   (TBD)    Recommendations for Other Services       Precautions / Restrictions Precautions Precautions: Fall Precaution Comments: G-tube Restrictions Weight Bearing Restrictions: No    Mobility  Bed Mobility Overal bed mobility: Needs Assistance Bed Mobility: Supine to Sit;Sit to Supine     Supine to sit: Min guard;Mod assist;HOB elevated Sit to supine: Min assist;Mod assist;HOB elevated   General bed mobility comments: Pt is confused and agitated. Assist required due to impulsiveness and poor sequencing. Appears to possess adequate strength to perform  Transfers Overall transfer level: Needs assistance Equipment used: Rolling walker (2 wheeled) Transfers: Sit to/from Stand Sit to Stand: Mod assist;+2 physical  assistance;+2 safety/equipment         General transfer comment: Pt very impulsive during transfer. Performed multiple times with patient while CNA changing linens and gown due to pt urinating on bed.   Ambulation/Gait Ambulation/Gait assistance: Max assist;+2 physical assistance Ambulation Distance (Feet): 3 Feet Assistive device: Rolling walker (2 wheeled)       General Gait Details: Attempted ambulation but pt is very impulsive and unsafe. Does not follow commands and will not return to bed. Pt requires modA to get back to bed to sit down due to poor awarenes, agitation, and impulsivity   Stairs            Wheelchair Mobility    Modified Rankin (Stroke Patients Only)       Balance Overall balance assessment: Needs assistance   Sitting balance-Leahy Scale: Good       Standing balance-Leahy Scale: Fair Standing balance comment: At times pt able to remain standing during gown change without UE support. Other times she needs UE support                    Cognition Arousal/Alertness: Awake/alert Behavior During Therapy: Impulsive;Restless;Agitated Overall Cognitive Status: Impaired/Different from baseline Area of Impairment: Orientation Orientation Level: Time;Situation                  Exercises General Exercises - Lower Extremity Long Arc Quad: Strengthening;Both;10 reps;Seated Hip ABduction/ADduction: Strengthening;Both;10 reps;Supine Straight Leg Raises: Strengthening;Both;10 reps;Supine Hip Flexion/Marching: Strengthening;Both;10 reps;Seated    General Comments General comments (skin integrity, edema, etc.): Skin laceration on R arm  near elbow      Pertinent Vitals/Pain Pain Assessment: Faces Faces Pain Scale: No hurt Pain Location: Pt unable to answer questions regarding pain    Home Living                      Prior Function            PT Goals (current goals can now be found in the care plan section) Acute Rehab PT  Goals Patient Stated Goal: to improve independence with functional mobility PT Goal Formulation: With family Time For Goal Achievement: 10/17/15 Potential to Achieve Goals: Fair Progress towards PT goals: Not progressing toward goals - comment (Agitation, impulsivity, poor command follow)    Frequency  Min 2X/week    PT Plan Current plan remains appropriate    Co-evaluation             End of Session Equipment Utilized During Treatment: Gait belt Activity Tolerance: Treatment limited secondary to agitation Patient left: in bed;with call bell/phone within reach;with bed alarm set (sitter present)     Time: 1650-1705 PT Time Calculation (min) (ACUTE ONLY): 15 min  Charges:  $Therapeutic Activity: 8-22 mins                    G Codes:      Lyndel Safe Nayquan Evinger PT, DPT   Reve Crocket 10/04/2015, 5:17 PM
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
## 2015-10-04 NOTE — Progress Notes (Signed)
Patient A/ with some disorientation of time and situation. Patient does not follow commands. Restarted IV left Forearm 22g. Patient had episodes of anxious and agitation, administered prn regimen (effective). Continues to need sitter. Neuro and CIWA checks continuous, see flowsheet. Staff will continue to monitor and maintain safety. Right arm abrasion OTA.
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
## 2015-10-05 ENCOUNTER — Inpatient Hospital Stay: Payer: Medicare Other

## 2015-10-05 LAB — T3: T3 TOTAL: 138 ng/dL (ref 71–180)

## 2015-10-05 NOTE — Evaluation (Signed)
Clinical/Bedside Swallow Evaluation Patient Details  Name: [name] MRN: [id number] Date of Birth: 02-26-1956  Today's Date: 10/05/2015 Time: SLP Start Time (ACUTE ONLY): 1015 SLP Stop Time (ACUTE ONLY): 1100 SLP Time Calculation (min) (ACUTE ONLY): 45 min  Past Medical History:  Past Medical History  Diagnosis Date  . ETOH abuse   . CVA (cerebral infarction)   . Acute CHF (congestive heart failure) ([place])   . Stress-induced cardiomyopathy   . Clostridium difficile colitis   . Seizures ([place])   . Hyperlipidemia   . Cancer ([place])   . Cervical cancer ([place])   . Throat cancer [facility])    Past Surgical History:  Past Surgical History  Procedure Laterality Date  . Coronary artery bypass graft    . Colonoscopy with propofol N/A 04/23/2015    Procedure: COLONOSCOPY WITH PROPOFOL;  Surgeon: Manya Silvas, MD;  [facility] ENDOSCOPY;  Service: Endoscopy;  Laterality: N/A;  . 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 She states that her legs "gave out" she stood from a seated position to reach a TV remote. However in the emergency department the patient was witnessed to have a global tonic-clonic seizure area and she was given Ativan which stopped the seizure activity but remained postictal. She was found to have hypokalemia. Pt has a h/o CVA, ETOH abuse (recent?), seizures, and other medical issues(see chart). Pt has a long-standing h/o dysphagia as well and was last seen for MBSS in 5.2016 w/ recommendation for a Dys. 2 diet w/ Nectar consistency liquids. Pt does have a PEG tube but it is not being used at this time per NSG. Per report by NSG and pt, pt is exhibiting overt s/s of aspiration w/ po's, primarily thin liquids. Unsure if pt is following the rec'd Dysphagia diet at home.     Assessment / Plan / Recommendation Clinical Impression  Pt appeared to present w/ overt s/s of aspiration w/ cup/tsp trials of thin liquids; pt also exhibited a congested (upper airway) cough - moderate in nature post the trials. Suspect d/t pt's h/o dysphagia, pt is at high risk for aspiration to be occuring. And, d/t pt stating she drank thin liquids at home, an objective assessment is nec. to determine pt's degree of dysphagia and the appropriate diet for her (previous diet rec. was a Dys. 2 w/ Necttar liquids, and pt is currently on a regular diet per MD). NSG updated and agreed. MD agreed. MBSS ordered.     Aspiration Risk  Severe aspiration risk (w/ curent diet)    Diet Recommendation  NPO currently until MBSS  Medication Administration: Crushed with puree (appears to be tolerating this per NSG) Compensations: Minimize environmental distractions;Slow rate;Small sips/bites;Follow solids with liquid    Other  Recommendations Recommended Consults:  (TBD) Oral Care Recommendations: Oral care BID;Staff/trained caregiver to provide oral care Other Recommendations:  (TBD)   Follow up Recommendations  Skilled Nursing facility (TBD)    Frequency and Duration min 2x/week  1 week       Prognosis Prognosis for Safe Diet Advancement: Guarded (at this time) Barriers to Reach Goals: Cognitive deficits;Severity of deficits;Behavior      Swallow Study   General Date of Onset: 10/03/15 HPI: patient originally presents to the emergency department after suffering a fall. She states that her legs "  gave out" she stood from a seated position to reach a TV remote. However in the emergency department the patient was witnessed to have a global tonic-clonic seizure area and she was given Ativan which stopped the seizure activity but remained postictal. She was found to have hypokalemia. Pt has a h/o CVA, ETOH abuse (recent?), seizures, and other medical issues(see chart). Pt has a long-standing h/o  dysphagia as well and was last seen for MBSS in 5.2016 w/ recommendation for a Dys. 2 diet w/ Nectar consistency liquids. Pt does have a PEG tube but it is not being used at this time per NSG. Per report by NSG and pt, pt is exhibiting overt s/s of aspiration w/ po's, primarily thin liquids. Unsure if pt is following the rec'd Dysphagia diet at home.  Type of Study: Bedside Swallow Evaluation Previous Swallow Assessment: previous MBSSs; last one 5.2016.  Diet Prior to this Study: Regular;Thin liquids (per pt) Temperature Spikes Noted: No Respiratory Status: Room air History of Recent Intubation: No Behavior/Cognition: Alert;Cooperative;Pleasant mood;Confused;Requires cueing;Distractible Oral Cavity Assessment: Within Functional Limits Oral Care Completed by SLP: Recent completion by staff Oral Cavity - Dentition: Missing dentition Vision: Functional for self-feeding Self-Feeding Abilities: Able to feed self;Needs assist;Needs set up Patient Positioning: Upright in bed Baseline Vocal Quality: Normal;Low vocal intensity (gravely) Volitional Cough: Strong;Congested Volitional Swallow: Able to elicit    Oral/Motor/Sensory Function Overall Oral Motor/Sensory Function: Within functional limits   Ice Chips Ice chips: Within functional limits Presentation: Spoon (fed; x3)   Thin Liquid Thin Liquid: Impaired Presentation: Cup;Spoon (fed; x2 trials each) Oral Phase Impairments: Reduced labial seal;Poor awareness of bolus Oral Phase Functional Implications:  (anterior spillage x1) Pharyngeal  Phase Impairments: Cough - Delayed;Cough - Immediate (x3)    Nectar Thick Nectar Thick Liquid: Not tested   Honey Thick Honey Thick Liquid: Not tested   Puree Puree: Within functional limits Presentation: Spoon (fed; x1 w/ NSG)   Solid Solid: Not tested      Orinda Kenner, MS, CCC-SLP  Vida Nicol 10/05/2015,1:26 PM

## 2015-10-05 NOTE — Plan of Care (Signed)
Problem: Safety: Goal: Ability to remain free from injury will improve Outcome: Not Progressing Pt will not follow commands and has poor safety judgement.

## 2015-10-05 NOTE — Progress Notes (Signed)
  CONCERNING: IV to Oral Route Change Policy  RECOMMENDATION: This patient is receiving thiamine by the intravenous route.  Based on criteria approved by the Pharmacy and Therapeutics Committee, the intravenous medication(s) is/are being converted to the equivalent oral dose form(s).   DESCRIPTION: These criteria include:  The patient is eating (either orally or via tube) and/or has been taking other orally administered medications for a least 24 hours  The patient has no evidence of active gastrointestinal bleeding or impaired GI absorption (gastrectomy, short bowel, patient on TNA or NPO).  If you have questions about this conversion, please contact the Pharmacy Department  []   [phone] )  [name] [x]   [phone] )  [facility] []   [phone] )  [facility] []   [phone] )  [facility] []   [phone] )  [place], [facility] 10/05/2015 8:51 AM

## 2015-10-05 NOTE — Care Management Important Message (Signed)
Important Message  Patient Details  Name: [name] MRN: [id number] Date of Birth: [birth year]   Medicare Important Message Given:  Yes    [name] A, RN 10/05/2015, 8:32 AM

## 2015-10-05 NOTE — Progress Notes (Signed)
[name] at [place] NAME: [name]    MR#:  [id number]  DATE OF BIRTH:  06/03/1956  SUBJECTIVE:  CHIEF COMPLAINT:  Much more awake had swallow eval done diet modified  REVIEW OF SYSTEMS:   CONSTITUTIONAL: No documented fever. No fatigue, weakness. No weight gain, no weight loss.  EYES: No blurry or double vision.  ENT: No tinnitus. No postnasal drip. No redness of the oropharynx.  RESPIRATORY: No cough, no wheeze, no hemoptysis. No dyspnea.  CARDIOVASCULAR: No chest pain. No orthopnea. No palpitations. No syncope.  GASTROINTESTINAL: No nausea, no vomiting or diarrhea. No abdominal pain. No melena or hematochezia.  GENITOURINARY:  No urgency. No frequency. No dysuria. No hematuria. No obstructive symptoms. No discharge. No pain. No significant abnormal bleeding ENDOCRINE: No polyuria or nocturia. No heat or cold intolerance.  HEMATOLOGY: No anemia. No bruising. No bleeding. No purpura. No petechiae INTEGUMENTARY: No rashes. No lesions.  MUSCULOSKELETAL: No arthritis. No swelling. No gout.  NEUROLOGIC: Denies any numbness PSYCHIATRIC: No anxiety. No insomnia. No ADD.     DRUG ALLERGIES:   Allergies  Allergen Reactions  . Geodon [Ziprasidone Hcl] Other (See Comments)    Unknown reaction  . Haldol [Haloperidol] Other (See Comments)    Unknown reaction    VITALS:  Blood pressure 117/85, pulse 107, temperature 97.3 F (36.3 C), temperature source Oral, resp. rate 18, height 5\' 1"  (1.549 m), weight 44.271 kg (97 lb 9.6 oz), SpO2 100 %.  PHYSICAL EXAMINATION:  GENERAL:  59 y.o.-year-old patient lying in the bed with no acute distress.  EYES: Pupils equal, round, reactive to light and accommodation. No scleral icterus.   HEENT: Head atraumatic, normocephalic. Oropharynx and nasopharynx clear.  NECK:  Supple, no jugular venous distention. No thyroid enlargement, no tenderness.  LUNGS: Normal breath sounds bilaterally, no  wheezing, rales,rhonchi or crepitation. No use of accessory muscles of respiration.  CARDIOVASCULAR: S1, S2 normal. No murmurs, rubs, or gallops.  ABDOMEN: Soft, nontender, nondistended. Bowel sounds present. No organomegaly or mass.  EXTREMITIES: No pedal edema, cyanosis, or clubbing.  NEUROLOGIC: Has generalized weakness cranial nerves II-12 appear intact PSYCHIATRIC: Not anxious to depressed SKIN: No obvious rash, lesion, or ulcer.    LABORATORY PANEL:   CBC  Recent Labs Lab 10/04/15 0527  WBC 5.6  HGB 10.8*  HCT 34.3*  PLT 336   ------------------------------------------------------------------------------------------------------------------  Chemistries   Recent Labs Lab 10/04/15 0527  NA 137  K 3.5  CL 104  CO2 25  GLUCOSE 68  BUN 8  CREATININE 0.84  CALCIUM 9.3  MG 1.7  AST 18  ALT 7*  ALKPHOS 65  BILITOT 0.7   ------------------------------------------------------------------------------------------------------------------  Cardiac Enzymes  Recent Labs Lab 10/03/15 0145  TROPONINI <0.03   ------------------------------------------------------------------------------------------------------------------  RADIOLOGY:  No results found.  EKG:   Orders placed or performed during the hospital encounter of 02/25/15  . ED EKG  . ED EKG  . EKG 12-Lead  . EKG 12-Lead  . EKG    ASSESSMENT AND PLAN:  This is a 59 year old Afro-American female admitted for encephalopathy and seizure disorder. 1. Encephalopathy: Likely post ictal seizure  Now resolved   2. Seizure disorder:  Continue Keppra   3. Alcohol abuse: History of drinking but according to the niece none now  4. Coronary artery disease: Stable;. Continue Plavix   5.history of C. difficile colitis in June 2016: Patient with no diarrhea during this admission per medical staff  Continue acidophilus supplementation  6. Lipidemia: Continue Lipitor for cerebrovascular protection  7.  Mood disorder: hold sertraline and Seroquel as patient is with altered mentation  8. DVT prophylaxis: Heparin  9. GI prophylaxis: H2 blocker per home regimen   10.elevated TSH-check free T 4 and T3 normal   All the records are reviewed and case discussed with Care Management/Social Workerr. Management plans discussed with the patient, family and they are in agreement.  CODE STATUS: fc  TOTAL TIME TAKING CARE OF THIS PATIENT: 35 minutes.      Dustin Flock M.D on 10/05/2015 at 1:00 PM  Between 7am to 6pm - Pager - [phone] After 6pm go to [email or web address] - password [email or web address] [facility]  Office  [phone]  CC: Primary care physician; Juluis Pitch, MD

## 2015-10-05 NOTE — Progress Notes (Signed)
Upon assessment, Pt was noted to have no IV access. Will page MD regarding IV status.

## 2015-10-05 NOTE — Progress Notes (Signed)
Pt is sleeping in bed, bed alarm is on.

## 2015-10-05 NOTE — Evaluation (Signed)
Objective Swallowing Evaluation: MBS-Modified Barium Swallow Study  Patient Details  Name: [name] MRN: [id number] Date of Birth: [birth year]  Today's Date: 10/05/2015 Time: SLP Start Time (ACUTE ONLY): 1105-SLP Stop Time (ACUTE ONLY): 1200 SLP Time Calculation (min) (ACUTE ONLY): 55 min  Past Medical History:  Past Medical History  Diagnosis Date  . ETOH abuse   . CVA (cerebral infarction)   . Acute CHF (congestive heart failure) ([place])   . Stress-induced cardiomyopathy   . Clostridium difficile colitis   . Seizures ([place])   . Hyperlipidemia   . Cancer ([place])   . Cervical cancer ([place])   . Throat cancer [facility])    Past Surgical History:  Past Surgical History  Procedure Laterality Date  . Coronary artery bypass graft    . Colonoscopy with propofol N/A 04/23/2015    Procedure: COLONOSCOPY WITH PROPOFOL;  Surgeon: Manya Silvas, MD;  Location: [facility] ENDOSCOPY;  Service: Endoscopy;  Laterality: N/A;  . 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 She states that her legs "gave out" she stood from a seated position to reach a TV remote. However in the emergency department the patient was witnessed to have a global tonic-clonic seizure area and she was given Ativan which stopped the seizure activity but remained postictal. She was found to have hypokalemia. Pt has a h/o CVA, ETOH abuse (recent?), seizures, and other medical issues(see chart). Pt has a long-standing h/o dysphagia as well and was last seen for MBSS in 5.2016 w/ recommendation for a Dys. 2 diet w/ Nectar consistency liquids. Pt does have a PEG tube but it is not being used at this time per NSG. Per report by NSG and pt, pt is exhibiting overt s/s of aspiration w/ po's, primarily thin liquids.   Subjective: pt awake, sitting in  chair fidgity and bunching up the blankets and towels.Pt required verbal cues to attend to tasks.   CHL IP CLINICAL IMPRESSIONS 10/05/2015  Therapy Diagnosis Severe pharyngeal phase dysphagia;Moderate cervical esophageal phase dysphagia  Clinical Impression Pt presented w/ oral phase deficits c/b decreased bolus control, moreso of liquids, w/ premature spillage into the pharynx prior to the swallow. Pt also exhibited slower bolus manipulation w/ puree trials and slow A-P transfer. Pt demo. adequate clearing of purees post swallow. Pt presented w/ moderate-severe delay in pharyngeal swallow initiation w/ Nectar and Honey consistency liquids and purees, moreso the Nectar liquids. This resulted in laryngeal penetration and aspiration, before and during, the swallow w/ Nectar liquid trials by TSP(for bolus amount control) x2/4. Of concern, it pt's reduced sensation and cough responses to the laryngeal penetration and aspiration(a delayed cough was noted x1 when aspiration occured). Pt was then given trials of Honey consistency liquids via TSP w/ no immediate laryngeal penetration or aspiration noted. Using small, single amount(via TSP) appeared most efficient in reducing risk for laryngeal penetration/aspiration. She also demo. decreased min. laryngeal elevation/excursion and pharyngeal pressure resulting in min. pharyngeal residue w/ all consistencies post swallowing. Pt was able to reduce/clear this pharyngeal residue w/ a f/u swallow. Suspect min. amount of valleculae residue could be related to the increased Esophageal pressure noted from the moderate dysmotility noted during a brief Esophageal scan/screen of the upper Esophagus - slight bolus stasis noted during trials. This degree of pharyngeal phase dysphagia in addition to any  Esophageal dysmotility and bolus retention in the Esophagus and poor motility greatly increases pt's risk for aspiration during the swallowing and after w/ regard to Reflux material.  Unsure of pt's level of understanding of the results of the exam and degree of dysphagia and her risk for aspiration.   Impact on safety and function Moderate aspiration risk  Strict aspiration precautions and monitoring of pt's respiratory status for any decline. Rec. Dietician consult for  f/u w/ nutritional needs; water flushes via PEG tube for hydration needs.     CHL IP TREATMENT RECOMMENDATION 10/05/2015  Treatment Recommendations Therapy as outlined in treatment plan below     Prognosis 10/05/2015  Prognosis for Safe Diet Advancement Fair  Barriers to Reach Goals Cognitive deficits;Severity of deficits;Behavior  Barriers/Prognosis Comment --    CHL IP DIET RECOMMENDATION 10/05/2015  SLP Diet Recommendations Dysphagia 1 (Puree) solids;Honey thick liquids  Liquid Administration via Spoon  Medication Administration Crushed with puree  Compensations Minimize environmental distractions;Slow rate;Small sips/bites;Follow solids with liquid  Postural Changes Seated upright at 90 degrees      CHL IP OTHER RECOMMENDATIONS 10/05/2015  Recommended Consults (No Data)  Oral Care Recommendations Oral care BID;Staff/trained caregiver to provide oral care  Other Recommendations Order thickener from pharmacy;Prohibited food (jello, ice cream, thin soups);Remove water pitcher      CHL IP FOLLOW UP RECOMMENDATIONS 10/05/2015  Follow up Recommendations Skilled Nursing facility      [facility] IP FREQUENCY AND DURATION 10/05/2015  Speech Therapy Frequency (ACUTE ONLY) min 2x/week  Treatment Duration 1 week           CHL IP ORAL PHASE 10/05/2015  Oral Phase Impaired  Oral - Pudding Teaspoon --  Oral - Pudding Cup --  Oral - Honey Teaspoon --  Oral - Honey Cup --  Oral - Nectar Teaspoon --  Oral - Nectar Cup --  Oral - Nectar Straw --  Oral - Thin Teaspoon --  Oral - Thin Cup --  Oral - Thin Straw --  Oral - Puree --  Oral - Mech Soft --  Oral - Regular --  Oral - Multi-Consistency --   Oral - Pill --  Oral Phase - Comment --    CHL IP PHARYNGEAL PHASE 10/05/2015  Pharyngeal Phase Impaired  Pharyngeal- Pudding Teaspoon --  Pharyngeal --  Pharyngeal- Pudding Cup --  Pharyngeal --  Pharyngeal- Honey Teaspoon --  Pharyngeal --  Pharyngeal- Honey Cup --  Pharyngeal --  Pharyngeal- Nectar Teaspoon --  Pharyngeal --  Pharyngeal- Nectar Cup --  Pharyngeal --  Pharyngeal- Nectar Straw --  Pharyngeal --  Pharyngeal- Thin Teaspoon --  Pharyngeal --  Pharyngeal- Thin Cup --  Pharyngeal --  Pharyngeal- Thin Straw --  Pharyngeal --  Pharyngeal- Puree --  Pharyngeal --  Pharyngeal- Mechanical Soft --  Pharyngeal --  Pharyngeal- Regular --  Pharyngeal --  Pharyngeal- Multi-consistency --  Pharyngeal --  Pharyngeal- Pill --  Pharyngeal --  Pharyngeal Comment --     CHL IP CERVICAL ESOPHAGEAL PHASE 10/05/2015  Cervical Esophageal Phase Impaired  Pudding Teaspoon --  Pudding Cup --  Honey Teaspoon --  Honey Cup --  Nectar Teaspoon --  Nectar Cup --  Nectar Straw --  Thin Teaspoon --  Thin Cup --  Thin Straw --  Puree --  Mechanical Soft --  Regular --  Multi-consistency --  Pill --  Cervical Esophageal Comment --    Orinda Kenner, MS, CCC-SLP  Watson,Katherine 10/05/2015, 1:12 PM

## 2015-10-05 NOTE — Plan of Care (Signed)
Problem: Nutrition: Goal: Adequate nutrition will be maintained Outcome: Not Progressing Pending speech consult to determine proper diet.

## 2015-10-05 NOTE — Progress Notes (Signed)
Initial Nutrition Assessment     INTERVENTION:  Meals and snacks: Cater to pt preferences within diet restrictions Medical Nutrition Supplement therapy: recommend discontinuing ensure (not honey thick) and sending honey thick mightyshake TID for added nutrition Coordination of care: recommend flush PEG tube with 63ml water q shift to keep tube patent   NUTRITION DIAGNOSIS:   Inadequate oral intake related to acute illness as evidenced by meal completion < 25%.    GOAL:   Patient will meet greater than or equal to 90% of their needs    MONITOR:    (Energy intake, Electrolyte and renal profile, Glucose profile)  REASON FOR ASSESSMENT:    (dysphagia 1 and honey thick liquids)    ASSESSMENT:      Pt admitted with seizure Pt can't remember when PEG tube was placed.  Reports she was not eating enough so a PEG tube was placed  Past Medical History  Diagnosis Date  . ETOH abuse   . CVA (cerebral infarction)   . Acute CHF (congestive heart failure) ([place])   . Stress-induced cardiomyopathy   . Clostridium difficile colitis   . Seizures ([place])   . Hyperlipidemia   . Cancer ([place])   . Cervical cancer ([place])   . Throat cancer [facility])     Current Nutrition: nothing eaten off lunch tray today, tray observed.    Food/Nutrition-Related History: pt reports appetite has been up and down prior to admission.   Reports has not used PEG tube in the last 6 months because was eating "enough" Reports when takes tube feeding she vomits shortly after taking feeding.   Scheduled Medications:  . acidophilus  2 capsule Oral BID WC  . atorvastatin  10 mg Oral QHS  . clonazePAM  0.5 mg Oral TID  . clopidogrel  75 mg Oral Daily  . docusate sodium  100 mg Oral BID  . donepezil  5 mg Oral QHS  . enoxaparin (LOVENOX) injection  40 mg Subcutaneous Q24H  . famotidine  20 mg Oral Daily  . folic acid  1 mg Oral Daily  . furosemide  20 mg Oral Daily  . levETIRAcetam  500 mg Oral BID  .  magnesium oxide  400 mg Oral Daily  . multivitamin with minerals  1 tablet Oral Daily  . predniSONE  5 mg Oral Q breakfast  . sodium chloride  3 mL Intravenous Q12H  . thiamine  100 mg Oral Daily  . vitamin B-12  1,000 mcg Oral Daily       Electrolyte/Renal Profile and Glucose Profile:   Recent Labs Lab 10/03/15 0145 10/04/15 0527  NA 143 137  K 3.1* 3.5  CL 105 104  CO2 27 25  BUN 10 8  CREATININE 0.97 0.84  CALCIUM 9.5 9.3  MG  --  1.7  GLUCOSE 100* 68   Protein Profile:  Recent Labs Lab 10/03/15 0145 10/04/15 0527  ALBUMIN 3.3* 3.2*    Gastrointestinal Profile: Last BM:12/21   Nutrition-Focused Physical Exam Findings: Nutrition-Focused physical exam completed. Findings are no  fat depletion, mild/moderate muscle depletion, and none edema.      Weight Change: 3% wt loss in the last 5 months per wt encounters    Diet Order:  DIET - DYS 1 Room service appropriate?: Yes; Fluid consistency:: Honey Thick DIET - DYS 1 Room service appropriate?: Yes with Assist; Fluid consistency:: Honey Thick  Skin:   reviewed   Height:   Ht Readings from Last 1 Encounters:  10/03/15 5\' 1"  (  1.549 m)    Weight:   Wt Readings from Last 1 Encounters:  10/05/15 97 lb 9.6 oz (44.271 kg)    Ideal Body Weight:     BMI:  Body mass index is 18.45 kg/(m^2).  Estimated Nutritional Needs:   Kcal:  BEE 978 kcals (IF 1.0-1.3, AF 1.3) [id number] kcals/d.   Protein:  (1.0-1.2 g/kg) 44-53 g/d  Fluid:  (25-32ml/kg) 1100-1358ml/d  EDUCATION NEEDS:   No education needs identified at this time  [place]. [name], [place], [place] (pager) Weekend/On-Call pager [phone])

## 2015-10-06 MED ORDER — CLONAZEPAM 0.5 MG PO TABS: 0.5000 mg | ORAL_TABLET | Freq: Three times a day (TID) | ORAL | Status: AC

## 2015-10-06 MED ORDER — PREDNISONE 5 MG PO TABS: 5.0000 mg | ORAL_TABLET | Freq: Every day | ORAL | Status: DC

## 2015-10-06 MED ORDER — FENTANYL 25 MCG/HR TD PT72: 25.0000 ug | MEDICATED_PATCH | TRANSDERMAL | Status: AC

## 2015-10-06 MED ORDER — QUETIAPINE FUMARATE 25 MG PO TABS: 25.0000 mg | ORAL_TABLET | Freq: Every day | ORAL | Status: AC

## 2015-10-06 MED ORDER — DOCUSATE SODIUM 100 MG PO CAPS: 100.0000 mg | ORAL_CAPSULE | Freq: Two times a day (BID) | ORAL | Status: DC

## 2015-10-06 MED ORDER — ONDANSETRON HCL 4 MG PO TABS: 4.0000 mg | ORAL_TABLET | Freq: Three times a day (TID) | ORAL | Status: AC | PRN

## 2015-10-06 NOTE — Discharge Summary (Signed)
Beaver, 59 y.o., DOB [birth year], MRN [id number]. Admission date: 10/03/2015 Discharge Date 10/06/2015 Primary MD DAVID Lovie Macadamia, MD Admitting Physician Harrie Foreman, MD  Admission Diagnosis  Hypokalemia [E87.6] Seizure [facility]) [R56.9] Encephalopathy [G93.40] Fall, initial encounter [W19.XXXA] Skin tear of forearm without complication, right, initial encounter [S51.811A]  Discharge Diagnosis   Active Problems: Acute  Encephalopathy related to seizure Seizure due to med noncompliance Fall Dysphagia due to CVA which is old noncompliance with diet History alcohol abuse none now History of CHF History of stress-induced cardiomyopathy History of C. Difficile Hyperlipidemia History of cervical cancer History throat cancer          Hospital Course  The patient 59 year old [place] American female with previous history of CVA who is supposed to be on a dysphagia diet. Who presents to the emergency department after suffering a fall. She states that her legs "gave out" she stood from a seated position to reach a TV remote. However in the emergency department the patient was witnessed to have a global tonic-clonic seizure area and she was given Ativan which stopped the seizure activity but remained postictal. There was a history of alcohol abuse in the past. However family reported that she has not drank in months. Patient during the hospitalization was confused and showed signs of withdrawal but again per family she was not drinking. Her symptoms were thought to be due to the possible postictal state. Patient's mental status now is back to baseline. She is very weak and deconditioned. And is need of further rehabilitation. She was seen by speech and her diet was modified further.              Consults  nephrology and neurology  Significant Tests:  See full reports for all details      Ct Head Wo Contrast  10/03/2015  CLINICAL DATA:  59 year old female with seizures  and fall. EXAM: CT HEAD WITHOUT CONTRAST TECHNIQUE: Contiguous axial images were obtained from the base of the skull through the vertex without intravenous contrast. COMPARISON:  Brain MRI dated 07/03/2015 and head CT dated 11/20/2014 FINDINGS: The ventricles are dilated and the sulci are prominent compatible with age-related atrophy. Periventricular and deep white matter hypodensities represent chronic microvascular ischemic changes. Left cerebellar old infarct and encephalomalacia. Right parietal occipital infarct and encephalomalacia changes. There is no intracranial hemorrhage. No mass effect or midline shift identified. The visualized paranasal sinuses and mastoid air cells are well aerated. The calvarium is intact. IMPRESSION: No acute intracranial hemorrhage. Age-related atrophy and chronic microvascular ischemic disease. Large left cerebellar and right posterior parietal old infarcts and encephalomalacia. Electronically Signed   By: Anner Crete M.D.   On: 10/03/2015 03:31   Dg Chest Port 1 View  10/03/2015  CLINICAL DATA:  59 year old female with seizures and fall. Laceration to the right forearm. EXAM: PORTABLE CHEST 1 VIEW COMPARISON:  Chest radiograph dated 12/14/2014 FINDINGS: Single-view of the chest does not demonstrate any focal consolidation. No pleural effusion or pneumothorax. Stable cardiac silhouette. Mechanical cardiac valve and median sternotomy wires noted. The osseous structures are grossly unremarkable. IMPRESSION: No active disease. Electronically Signed   By: Anner Crete M.D.   On: 10/03/2015 03:10       Today   Subjective:   [name]  complains of some nausea but other than that no other complaints  Objective:   Blood pressure 107/73, pulse 95, temperature 98.5 F (36.9 C), temperature source Oral, resp. rate 20, height 5\' 1"  (1.549 m), weight 43.772 kg (  96 lb 8 oz), SpO2 100 %.  .  Intake/Output Summary (Last 24 hours) at 10/06/15 0943 Last data filed at  10/06/15 0900  Gross per 24 hour  Intake      0 ml  Output      0 ml  Net      0 ml    Exam VITAL SIGNS: Blood pressure 107/73, pulse 95, temperature 98.5 F (36.9 C), temperature source Oral, resp. rate 20, height 5\' 1"  (1.549 m), weight 43.772 kg (96 lb 8 oz), SpO2 100 %.  GENERAL:  59 y.o.-year-old patient lying in the bed with no acute distress.  EYES: Pupils equal, round, reactive to light and accommodation. No scleral icterus. Extraocular muscles intact.  HEENT: Head atraumatic, normocephalic. Oropharynx and nasopharynx clear.  NECK:  Supple, no jugular venous distention. No thyroid enlargement, no tenderness.  LUNGS: Normal breath sounds bilaterally, no wheezing, rales,rhonchi or crepitation. No use of accessory muscles of respiration.  CARDIOVASCULAR: S1, S2 normal. No murmurs, rubs, or gallops.  ABDOMEN: Soft, nontender, nondistended. Bowel sounds present. No organomegaly or mass.  EXTREMITIES: No pedal edema, cyanosis, or clubbing.  NEUROLOGIC: Cranial nerves II through XII are intact. Muscle strength 5/5 in all extremities. Sensation intact. Gait not checked.  PSYCHIATRIC: The patient is alert and oriented x 3.  SKIN: No obvious rash, lesion, or ulcer.   Data Review     CBC w Diff: Lab Results  Component Value Date   WBC 5.6 10/04/2015   WBC 13.2* 11/20/2014   HGB 10.8* 10/04/2015   HGB 12.1 11/20/2014   HCT 34.3* 10/04/2015   HCT 38.0 11/20/2014   PLT 336 10/04/2015   PLT 292 11/20/2014   LYMPHOPCT 26 10/03/2015   LYMPHOPCT 7.9 08/18/2014   MONOPCT 13 10/03/2015   MONOPCT 9.4 08/18/2014   EOSPCT 1 10/03/2015   EOSPCT 0.1 08/18/2014   BASOPCT 0 10/03/2015   BASOPCT 0.4 08/18/2014   CMP: Lab Results  Component Value Date   NA 137 10/04/2015   NA 137 11/20/2014   K 3.5 10/04/2015   K 3.3* 11/20/2014   CL 104 10/04/2015   CL 100 11/20/2014   CO2 25 10/04/2015   CO2 25 11/20/2014   BUN 8 10/04/2015   BUN 14 11/20/2014   CREATININE 0.84 10/04/2015    CREATININE 1.42* 11/20/2014   PROT 7.3 10/04/2015   PROT 8.2 11/20/2014   ALBUMIN 3.2* 10/04/2015   ALBUMIN 3.4 11/20/2014   BILITOT 0.7 10/04/2015   BILITOT 0.5 11/20/2014   ALKPHOS 65 10/04/2015   ALKPHOS 77 11/20/2014   AST 18 10/04/2015   AST 34 11/20/2014   ALT 7* 10/04/2015   ALT 14 11/20/2014  .  Micro Results No results found for this or any previous visit (from the past 240 hour(s)).      Code Status Orders        Start     Ordered   10/03/15 0659  Full code   Continuous     10/03/15 0658    Advance Directive Documentation        Most Recent Value   Type of Advance Directive  Healthcare Power of Attorney   Pre-existing out of facility DNR order (yellow form or pink MOST form)     "MOST" Form in Place?            Follow-up Information    Follow up with [facility] .   Specialty:  Skilled Nursing Facility   Contact information:  [place] [phone]      Follow up with md at snf.      Discharge Medications     Medication List    STOP taking these medications        vancomycin 50 mg/mL oral solution  Commonly known as:  VANCOCIN      TAKE these medications        acidophilus Caps capsule  Take 2 capsules by mouth 2 (two) times daily with a meal.     atorvastatin 10 MG tablet  Commonly known as:  LIPITOR  Take 10 mg by mouth at bedtime.     clonazePAM 0.5 MG tablet  Commonly known as:  KLONOPIN  Take 1 tablet (0.5 mg total) by mouth 3 (three) times daily.     clopidogrel 75 MG tablet  Commonly known as:  PLAVIX  Take 75 mg by mouth daily.     docusate sodium 100 MG capsule  Commonly known as:  COLACE  Take 1 capsule (100 mg total) by mouth 2 (two) times daily.     donepezil 5 MG tablet  Commonly known as:  ARICEPT  Take 5 mg by mouth at bedtime.     famotidine 20 MG tablet  Commonly known as:  PEPCID  Take 20 mg by mouth 2 (two) times daily.     feeding supplement  (ENSURE ENLIVE) Liqd  Take 237 mLs by mouth 3 (three) times daily with meals.     fentaNYL 25 MCG/HR patch  Commonly known as:  DURAGESIC - dosed mcg/hr  Place 1 patch (25 mcg total) onto the skin every 3 (three) days.     folic acid [id number] MCG tablet  Commonly known as:  FOLVITE  Take 800 mcg by mouth daily.     furosemide 40 MG tablet  Commonly known as:  LASIX  Take 0.5 tablets (20 mg total) by mouth daily.     levETIRAcetam 500 MG tablet  Commonly known as:  KEPPRA  Take 500 mg by mouth 2 (two) times daily.     magnesium oxide 400 MG tablet  Commonly known as:  MAG-OX  Take 400 mg by mouth daily.     multivitamin tablet  Take 1 tablet by mouth daily.     ondansetron 4 MG tablet  Commonly known as:  ZOFRAN  Take 1 tablet (4 mg total) by mouth every 8 (eight) hours as needed for nausea or vomiting.     predniSONE 5 MG tablet  Commonly known as:  DELTASONE  Take 1 tablet (5 mg total) by mouth daily.     QUEtiapine 25 MG tablet  Commonly known as:  SEROQUEL  Take 1 tablet (25 mg total) by mouth at bedtime.     sertraline 100 MG tablet  Commonly known as:  ZOLOFT  Take 100 mg by mouth at bedtime.     thiamine 100 MG tablet  Commonly known as:  VITAMIN B-1  Take 100 mg by mouth daily.     vitamin B-12 1000 MCG tablet  Commonly known as:  CYANOCOBALAMIN  Take 1,000 mcg by mouth daily.           Total Time in preparing paper work, data evaluation and todays exam - 35 minutes  Dustin Flock M.D on 10/06/2015 at 9:43 AM  [facility]   Office  [phone]

## 2015-10-06 NOTE — Progress Notes (Signed)
10/06/2015 7:38 AM  Flushed pt's G tube with 30cc sterile water per dietician instructions.  Dola Argyle, RN

## 2015-10-06 NOTE — Clinical Social Work Placement (Signed)
   CLINICAL SOCIAL WORK PLACEMENT  NOTE  Date:  10/06/2015  Patient Details  Name: [name] MRN: [id number] Date of Birth: [birth year]  Clinical Social Work is seeking post-discharge placement for this patient at the [place] level of care (*CSW will initial, date and re-position this form in  chart as items are completed):  Yes   Patient/family provided with [place] Work Department's list of facilities offering this level of care within the geographic area requested by the patient (or if unable, by the patient's family).  Yes   Patient/family informed of their freedom to choose among providers that offer the needed level of care, that participate in Medicare, Medicaid or managed care program needed by the patient, have an available bed and are willing to accept the patient.  Yes   Patient/family informed of [place] [facility]'s ownership interest in [facility] and [facility], as well as of the fact that they are under no obligation to receive care at these facilities.  PASRR submitted to EDS on       PASRR number received on       Existing PASRR number confirmed on 10/06/15     FL2 transmitted to all facilities in geographic area requested by pt/family on 10/01/15     FL2 transmitted to all facilities within larger geographic area on       Patient informed that his/her managed care company has contracts with or will negotiate with certain facilities, including the following:        Yes   Patient/family informed of bed offers received.  Patient chooses bed at  [facility])     Physician recommends and patient chooses bed at  [facility])    Patient to be transferred to  ([facility]) on 10/06/15.  Patient to be transferred to facility by  (EMS)     Patient family notified on   of transfer.  Name of family member notified:   ([name]: neice)     PHYSICIAN       Additional Comment:     _______________________________________________ Shela Leff, LCSW 10/06/2015, 10:08 AM

## 2015-10-06 NOTE — Clinical Social Work Note (Signed)
Patient's pasrr: [id number] a.

## 2015-10-06 NOTE — Discharge Summary (Deleted)
  DIET:  Dysphagia 1 diet with honey thick liquids  DISCHARGE CONDITION:  Fair  ACTIVITY:  Activity as tolerated  OXYGEN:  Home Oxygen: No.   Oxygen Delivery: room air  DISCHARGE LOCATION:  nursing home    ADDITIONAL DISCHARGE INSTRUCTION:pt eval and treat, Free water 200cc every 8 hours down peg tube   If you experience worsening of your admission symptoms, develop shortness of breath, life threatening emergency, suicidal or homicidal thoughts you must seek medical attention immediately by calling 911 or calling your MD immediately  if symptoms less severe.  You Must read complete instructions/literature along with all the possible adverse reactions/side effects for all the Medicines you take and that have been prescribed to you. Take any new Medicines after you have completely understood and accpet all the possible adverse reactions/side effects.   Please note  You were cared for by a hospitalist during your hospital stay. If you have any questions about your discharge medications or the care you received while you were in the hospital after you are discharged, you can call the unit and asked to speak with the hospitalist on call if the hospitalist that took care of you is not available. Once you are discharged, your primary care physician will handle any further medical issues. Please note that NO REFILLS for any discharge medications will be authorized once you are discharged, as it is imperative that you return to your primary care physician (or establish a relationship with a primary care physician if you do not have one) for your aftercare needs so that they can reassess your need for medications and monitor your lab values.

## 2015-10-06 NOTE — Progress Notes (Signed)
Patient discharged to Nursing home. Skilled nursing facility called report called to [facility]. Patient is alert times 3. No acute distress noted. IV discontinued site without s/s of infiltration or infection.

## 2015-10-06 NOTE — Discharge Instructions (Signed)
°  DIET:  Dysphgia 1 diet with hone thick liquids, aspiration precaustions3  DISCHARGE CONDITION:  Stable  ACTIVITY:  Activity as tolerated  OXYGEN:  Home Oxygen: No.   Oxygen Delivery: room air  DISCHARGE LOCATION:  nursing home    ADDITIONAL DISCHARGE INSTRUCTION:free water flushes 200cc every 8 hours   If you experience worsening of your admission symptoms, develop shortness of breath, life threatening emergency, suicidal or homicidal thoughts you must seek medical attention immediately by calling 911 or calling your MD immediately  if symptoms less severe.  You Must read complete instructions/literature along with all the possible adverse reactions/side effects for all the Medicines you take and that have been prescribed to you. Take any new Medicines after you have completely understood and accpet all the possible adverse reactions/side effects.   Please note  You were cared for by a hospitalist during your hospital stay. If you have any questions about your discharge medications or the care you received while you were in the hospital after you are discharged, you can call the unit and asked to speak with the hospitalist on call if the hospitalist that took care of you is not available. Once you are discharged, your primary care physician will handle any further medical issues. Please note that NO REFILLS for any discharge medications will be authorized once you are discharged, as it is imperative that you return to your primary care physician (or establish a relationship with a primary care physician if you do not have one) for your aftercare needs so that they can reassess your need for medications and monitor your lab values.

## 2015-10-06 NOTE — Clinical Social Work Note (Signed)
Patient to discharge today to [facility]. [facility] is aware and discharge information sent. Nurse to call report. CSW contacted patient's niece, [name], and informed her of the discharge to [facility] and she verbalized agreement.  Shela Leff MSW,LCSW [phone]

## 2015-11-16 ENCOUNTER — Other Ambulatory Visit: Payer: Self-pay | Admitting: Family Medicine

## 2015-11-16 DIAGNOSIS — R1310 Dysphagia, unspecified: Secondary | ICD-10-CM

## 2015-12-02 ENCOUNTER — Other Ambulatory Visit
Admission: RE | Admit: 2015-12-02 | Discharge: 2015-12-02 | Disposition: A | Discharge status: Home / Self Care | Payer: Medicare Other | Source: Ambulatory Visit | Attending: Family Medicine | Admitting: Family Medicine

## 2015-12-02 DIAGNOSIS — R112 Nausea with vomiting, unspecified: Secondary | ICD-10-CM | POA: Insufficient documentation

## 2015-12-02 LAB — BASIC METABOLIC PANEL
Anion gap: 7 (ref 5–15)
BUN: 38 mg/dL — AB (ref 6–20)
CHLORIDE: 112 mmol/L — AB (ref 101–111)
CO2: 22 mmol/L (ref 22–32)
CREATININE: 1.07 mg/dL — AB (ref 0.44–1.00)
Calcium: 8.5 mg/dL — ABNORMAL LOW (ref 8.9–10.3)
GFR calc Af Amer: >60 mL/min (ref >60)
GFR calc non Af Amer: 57 mL/min — ABNORMAL LOW (ref >60)
Glucose, Bld: 137 mg/dL — ABNORMAL HIGH (ref 65–99)
Potassium: 2.5 mmol/L — CL (ref 3.5–5.1)
SODIUM: 141 mmol/L (ref 135–145)

## 2015-12-03 ENCOUNTER — Ambulatory Visit: Payer: Medicare Other

## 2015-12-03 ENCOUNTER — Ambulatory Visit
Admission: RE | Admit: 2015-12-03 | Discharge: 2015-12-03 | Disposition: A | Discharge status: Home / Self Care | Payer: Medicare Other | Source: Ambulatory Visit | Attending: Family Medicine | Admitting: Family Medicine

## 2015-12-03 DIAGNOSIS — R1312 Dysphagia, oropharyngeal phase: Secondary | ICD-10-CM

## 2015-12-03 DIAGNOSIS — R1310 Dysphagia, unspecified: Secondary | ICD-10-CM

## 2015-12-03 NOTE — Therapy (Signed)
[place], [place], [address] Phone: [phone]   Fax:     Modified Barium Swallow  Patient Details  Name: [name] MRN: [id number] Date of Birth: [birth year] No Data Recorded  Encounter Date: 12/03/2015      End of Session - 12/03/15 1343    Visit Number 1   Number of Visits 1   Date for SLP Re-Evaluation 12/03/15   SLP Start Time 1245   SLP Stop Time  1340   SLP Time Calculation (min) 55 min   Activity Tolerance Patient tolerated treatment well      Past Medical History  Diagnosis Date  . ETOH abuse   . CVA (cerebral infarction)   . Acute CHF (congestive heart failure) ([place])   . Stress-induced cardiomyopathy   . Clostridium difficile colitis   . Seizures ([place])   . Hyperlipidemia   . Cancer ([place])   . Cervical cancer ([place])   . Throat cancer [facility])     Past Surgical History  Procedure Laterality Date  . Coronary artery bypass graft    . Colonoscopy with propofol N/A 04/23/2015    Procedure: COLONOSCOPY WITH PROPOFOL;  Surgeon: Manya Silvas, MD;  Location: [facility];  Service: Endoscopy;  Laterality: N/A;  . 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  Visit Diagnosis: Oropharyngeal dysphagia  Dysphagia - Plan: DG OP Swallowing Func-Medicare/Speech Path, DG OP Swallowing Func-Medicare/Speech Path     Subjective: Patient behavior: (alertness, ability to follow instructions, etc.): Patient is awake, follows simple instructions, hoarse vocal quality  Chief complaint: long-standing dysphagia with PEG   Objective:  Radiological Procedure: A videoflouroscopic evaluation of oral-preparatory, reflex initiation, and pharyngeal phases of the swallow was performed; as well as a screening of the upper esophageal  phase.  I. POSTURE: Upright in MBS chair  II. VIEW: Lateral  III. COMPENSATORY STRATEGIES: Voluntary dry swallow- minimal clearance of pharyngeal residue IV. BOLUSES ADMINISTERED:    Thin Liquid: N/A   Nectar-thick Liquid: 2 teaspoon boluses   Honey-thick Liquid: N/A   Puree: 2 teaspoon boluses   Mechanical Soft: N/A  V. RESULTS OF EVALUATION: A. ORAL PREPARATORY PHASE: (The lips, tongue, and velum are observed for strength and coordination): disorganized bolus movement       **Overall Severity Rating: Moderate  B. SWALLOW INITIATION/REFLEX: (The reflex is normal if "triggered" by the time the bolus reached the base of the tongue) Triggers while falling from the valleculae to the pyriform sinuses  **Overall Severity Rating: Severe  C. PHARYNGEAL PHASE: (Pharyngeal function is normal if the bolus shows rapid, smooth, and continuous transit through the pharynx and there is no pharyngeal residue after the swallow) Reduced hyolaryngeal movement, reduced tongue base retraction, moderate pharyngeal residue  **Overall Severity Rating: severe  D. LARYNGEAL PENETRATION: (Material entering into the laryngeal inlet/vestibule but not aspirated) Trace with pureed solid  E. ASPIRATION: nectar-thick liquid (greater than trace) before swallow, delayed cough response  F. ESOPHAGEAL PHASE: (Screening of the upper esophagus): observed esophageal-to-esophagus backflow, slowed esophageal motility  ASSESSMENT: 60 year old woman with long-standing history of dysphagia due to multiple factors (CVA, ETOH abuse, seizures, throat cancer) is presenting with severe oropharyngeal dysphagia characterized by disorganized oral management, delayed pharyngeal swallow initiation, reduced tongue base retraction, reduced hyolaryngeal movement, moderate pharyngeal residue,  and frank aspiration with delayed cough response.  In addition, there was observed esophageal-to-pharynx backflow.  The patient is at significant  risk for both prandial aspiration and aspiration of reflux.  Prognosis for improved swallow safety is guarded.  Recommend using PEG for nutrition, hydration, and medication.  Recommend limited puree for pleasure only.  Recommend follow strict aspiration and reflux precautions.    PLAN/RECOMMENDATIONS:   A. Diet: NPO, limited puree solids for pleasure only   B. Swallowing Precautions: Strict aspiration precautions, strict reflux precautions   C. Recommended consultation to N/A   D. Therapy recommendations: follow up as needed   E. Results and recommendations were routed to referring physician and treating SLP.          G-Codes - 20-Dec-2015 1344    Functional Assessment Tool Used MBS, clinical judgment   Functional Limitations Swallowing   Swallow Current Status [id number]) At least 80 percent but less than 100 percent impaired, limited or restricted   Swallow Goal Status [id number]) At least 80 percent but less than 100 percent impaired, limited or restricted   Swallow Discharge Status [phone]) At least 80 percent but less than 100 percent impaired, limited or restricted          Problem List Patient Active Problem List   Diagnosis Date Noted  . Encephalopathy 10/03/2015  . Colitis 02/25/2015  . Cerebral infarction ([place]) 12/07/2014  . Dysphagia   . Acute respiratory failure with hypoxia ([place]) 11/29/2014  . Encounter for intubation 11/29/2014   Jean Sea, MS/CCC- SLP  Lou Miner Dec 20, 2015, 1:45 PM  [place], [place], [address] Phone: [phone]   Fax:     Name: [name] MRN: [id number] Date of Birth: July 18, 1956

## 2015-12-12 ENCOUNTER — Emergency Department: Payer: Medicare Other

## 2015-12-12 ENCOUNTER — Inpatient Hospital Stay
Admission: EM | Admit: 2015-12-12 | Discharge: 2016-01-12 | DRG: 193 | Disposition: E | Payer: Medicare Other | Attending: Internal Medicine | Admitting: Internal Medicine

## 2015-12-12 DIAGNOSIS — Z8673 Personal history of transient ischemic attack (TIA), and cerebral infarction without residual deficits: Secondary | ICD-10-CM

## 2015-12-12 DIAGNOSIS — Z7902 Long term (current) use of antithrombotics/antiplatelets: Secondary | ICD-10-CM

## 2015-12-12 DIAGNOSIS — G9341 Metabolic encephalopathy: Secondary | ICD-10-CM | POA: Diagnosis present

## 2015-12-12 DIAGNOSIS — E785 Hyperlipidemia, unspecified: Secondary | ICD-10-CM | POA: Diagnosis present

## 2015-12-12 DIAGNOSIS — R4182 Altered mental status, unspecified: Secondary | ICD-10-CM | POA: Diagnosis present

## 2015-12-12 DIAGNOSIS — Z85819 Personal history of malignant neoplasm of unspecified site of lip, oral cavity, and pharynx: Secondary | ICD-10-CM

## 2015-12-12 DIAGNOSIS — Z931 Gastrostomy status: Secondary | ICD-10-CM

## 2015-12-12 DIAGNOSIS — Z801 Family history of malignant neoplasm of trachea, bronchus and lung: Secondary | ICD-10-CM

## 2015-12-12 DIAGNOSIS — Z951 Presence of aortocoronary bypass graft: Secondary | ICD-10-CM

## 2015-12-12 DIAGNOSIS — J189 Pneumonia, unspecified organism: Principal | ICD-10-CM | POA: Diagnosis present

## 2015-12-12 DIAGNOSIS — D638 Anemia in other chronic diseases classified elsewhere: Secondary | ICD-10-CM | POA: Diagnosis present

## 2015-12-12 DIAGNOSIS — F329 Major depressive disorder, single episode, unspecified: Secondary | ICD-10-CM | POA: Diagnosis present

## 2015-12-12 DIAGNOSIS — M25552 Pain in left hip: Secondary | ICD-10-CM | POA: Diagnosis present

## 2015-12-12 DIAGNOSIS — W19XXXA Unspecified fall, initial encounter: Secondary | ICD-10-CM | POA: Diagnosis present

## 2015-12-12 DIAGNOSIS — I469 Cardiac arrest, cause unspecified: Secondary | ICD-10-CM | POA: Insufficient documentation

## 2015-12-12 DIAGNOSIS — Y95 Nosocomial condition: Secondary | ICD-10-CM | POA: Diagnosis not present

## 2015-12-12 DIAGNOSIS — Z9119 Patient's noncompliance with other medical treatment and regimen: Secondary | ICD-10-CM

## 2015-12-12 DIAGNOSIS — A419 Sepsis, unspecified organism: Secondary | ICD-10-CM

## 2015-12-12 DIAGNOSIS — I248 Other forms of acute ischemic heart disease: Secondary | ICD-10-CM | POA: Diagnosis present

## 2015-12-12 DIAGNOSIS — I5032 Chronic diastolic (congestive) heart failure: Secondary | ICD-10-CM | POA: Diagnosis present

## 2015-12-12 DIAGNOSIS — Z79899 Other long term (current) drug therapy: Secondary | ICD-10-CM

## 2015-12-12 DIAGNOSIS — Z515 Encounter for palliative care: Secondary | ICD-10-CM | POA: Diagnosis present

## 2015-12-12 DIAGNOSIS — E860 Dehydration: Secondary | ICD-10-CM | POA: Diagnosis present

## 2015-12-12 DIAGNOSIS — Z8541 Personal history of malignant neoplasm of cervix uteri: Secondary | ICD-10-CM

## 2015-12-12 DIAGNOSIS — D649 Anemia, unspecified: Secondary | ICD-10-CM

## 2015-12-12 DIAGNOSIS — F17210 Nicotine dependence, cigarettes, uncomplicated: Secondary | ICD-10-CM | POA: Diagnosis present

## 2015-12-12 DIAGNOSIS — I429 Cardiomyopathy, unspecified: Secondary | ICD-10-CM | POA: Diagnosis present

## 2015-12-12 DIAGNOSIS — E43 Unspecified severe protein-calorie malnutrition: Secondary | ICD-10-CM | POA: Diagnosis present

## 2015-12-12 DIAGNOSIS — I959 Hypotension, unspecified: Secondary | ICD-10-CM | POA: Diagnosis present

## 2015-12-12 DIAGNOSIS — Z833 Family history of diabetes mellitus: Secondary | ICD-10-CM

## 2015-12-12 DIAGNOSIS — J9600 Acute respiratory failure, unspecified whether with hypoxia or hypercapnia: Secondary | ICD-10-CM | POA: Diagnosis present

## 2015-12-12 LAB — CBC WITH DIFFERENTIAL/PLATELET
Basophils Absolute: 0 10*3/uL (ref 0–0.1)
Basophils Relative: 0 %
EOS ABS: 0 10*3/uL (ref 0–0.7)
EOS PCT: 0 %
HCT: 28.7 % — ABNORMAL LOW (ref 35.0–47.0)
HEMOGLOBIN: 9.1 g/dL — AB (ref 12.0–16.0)
LYMPHS ABS: 0.7 10*3/uL — AB (ref 1.0–3.6)
LYMPHS PCT: 5 %
MCH: 23.1 pg — AB (ref 26.0–34.0)
MCHC: 31.8 g/dL — AB (ref 32.0–36.0)
MCV: 72.6 fL — AB (ref 80.0–100.0)
MONOS PCT: 8 %
Monocytes Absolute: 1.2 10*3/uL — ABNORMAL HIGH (ref 0.2–0.9)
NEUTROS PCT: 87 %
Neutro Abs: 12.1 10*3/uL — ABNORMAL HIGH (ref 1.4–6.5)
Platelets: 328 10*3/uL (ref 150–440)
RBC: 3.95 MIL/uL (ref 3.80–5.20)
RDW: 18.5 % — ABNORMAL HIGH (ref 11.5–14.5)
WBC: 14 10*3/uL — ABNORMAL HIGH (ref 3.6–11.0)

## 2015-12-12 LAB — COMPREHENSIVE METABOLIC PANEL
ALK PHOS: 59 U/L (ref 38–126)
ALT: 12 U/L — AB (ref 14–54)
ANION GAP: 8 (ref 5–15)
AST: 22 U/L (ref 15–41)
Albumin: 2.3 g/dL — ABNORMAL LOW (ref 3.5–5.0)
BUN: 21 mg/dL — ABNORMAL HIGH (ref 6–20)
CALCIUM: 11.5 mg/dL — AB (ref 8.9–10.3)
CO2: 23 mmol/L (ref 22–32)
CREATININE: 0.92 mg/dL (ref 0.44–1.00)
Chloride: 107 mmol/L (ref 101–111)
Glucose, Bld: 97 mg/dL (ref 65–99)
Potassium: 3.9 mmol/L (ref 3.5–5.1)
SODIUM: 138 mmol/L (ref 135–145)
TOTAL PROTEIN: 7 g/dL (ref 6.5–8.1)
Total Bilirubin: 0.3 mg/dL (ref 0.3–1.2)

## 2015-12-12 LAB — URINALYSIS COMPLETE WITH MICROSCOPIC (ARMC ONLY)
BACTERIA UA: NONE SEEN
BILIRUBIN URINE: NEGATIVE
GLUCOSE, UA: NEGATIVE mg/dL
HGB URINE DIPSTICK: NEGATIVE
KETONES UR: NEGATIVE mg/dL
LEUKOCYTES UA: NEGATIVE
NITRITE: NEGATIVE
Protein, ur: NEGATIVE mg/dL
SPECIFIC GRAVITY, URINE: 1.014 (ref 1.005–1.030)
WBC, UA: NONE SEEN WBC/hpf (ref 0–5)
pH: 5 (ref 5.0–8.0)

## 2015-12-12 LAB — ETHANOL: Alcohol, Ethyl (B): <5 mg/dL (ref <5)

## 2015-12-12 LAB — CK: Total CK: 24 U/L — ABNORMAL LOW (ref 38–234)

## 2015-12-12 LAB — AMMONIA: AMMONIA: 12 umol/L (ref 9–35)

## 2015-12-12 MED ORDER — SODIUM CHLORIDE 0.9 % IV BOLUS (SEPSIS)
1000.0000 mL | Freq: Once | INTRAVENOUS | Status: AC
  Administered 2015-12-12: 1000 mL via INTRAVENOUS

## 2015-12-12 MED ORDER — LEVOFLOXACIN IN D5W 750 MG/150ML IV SOLN
750.0000 mg | Freq: Once | INTRAVENOUS | Status: AC
  Administered 2015-12-12: 750 mg via INTRAVENOUS
  Filled 2015-12-12: qty 150

## 2015-12-12 MED ORDER — SODIUM CHLORIDE 0.9 % IV BOLUS (SEPSIS): 1000.0000 mL | Freq: Once | INTRAVENOUS | Status: DC

## 2015-12-12 NOTE — ED Notes (Addendum)
Lynnell Chad, RN from [facility] called to check on pt, states pt sleeps a lot normally and is non compliant. States she has some dementia and also failure to thrive (she empties her feedings after being fed and tells nurses at home that she wants to die). Pt has hx of alcoholism

## 2015-12-12 NOTE — ED Provider Notes (Signed)
[facility] Emergency Department Provider Note  ____________________________________________  Time seen: Approximately 8:09 PM  I have reviewed the triage vital signs and the nursing notes.   HISTORY  Chief Complaint Fall    HPI [name] is a 60 y.o. female , NAD, sleeping in the exam bed in exam room 44. Presents to the emergency department via EMS from [facility]. Report from EMS states the patient had a fall hitting her hip but uncertain which one, as well as the right elbow. The patient is a poor historian and no history can be obtained from her at this time. All history and information is based off of paperwork sent with the patient from [facility] as well as EMS reports.   Past Medical History  Diagnosis Date  . ETOH abuse   . CVA (cerebral infarction)   . Acute CHF (congestive heart failure) ([place])   . Stress-induced cardiomyopathy   . Clostridium difficile colitis   . Seizures ([place])   . Hyperlipidemia   . Cancer ([place])   . Cervical cancer ([place])   . Throat cancer [facility])     Patient Active Problem List   Diagnosis Date Noted  . Encephalopathy 10/03/2015  . Colitis 02/25/2015  . Cerebral infarction ([place]) 12/07/2014  . Dysphagia   . Acute respiratory failure with hypoxia ([place]) 11/29/2014  . Encounter for intubation 11/29/2014    Past Surgical History  Procedure Laterality Date  . Coronary artery bypass graft    . Colonoscopy with propofol N/A 04/23/2015    Procedure: COLONOSCOPY WITH PROPOFOL;  Surgeon: Manya Silvas, MD;  Location: [facility] ENDOSCOPY;  Service: Endoscopy;  Laterality: N/A;  . 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 acidophilus (RISAQUAD) CAPS capsule   Oral   Take 2 capsules by mouth 2 (two) times daily with a meal.   40  capsule   0   . atorvastatin (LIPITOR) 10 MG tablet   Oral   Take 10 mg by mouth at bedtime.         . clonazePAM (KLONOPIN) 0.5 MG tablet   Oral   Take 1 tablet (0.5 mg total) by mouth 3 (three) times daily.   30 tablet   0   . clopidogrel (PLAVIX) 75 MG tablet   Oral   Take 75 mg by mouth daily.         Marland Kitchen docusate sodium (COLACE) 100 MG capsule   Oral   Take 1 capsule (100 mg total) by mouth 2 (two) times daily.   10 capsule   0   . donepezil (ARICEPT) 5 MG tablet   Oral   Take 5 mg by mouth at bedtime.         . famotidine (PEPCID) 20 MG tablet   Oral   Take 20 mg by mouth 2 (two) times daily.         . feeding supplement, ENSURE ENLIVE, (ENSURE ENLIVE) LIQD   Oral   Take 237 mLs by mouth 3 (three) times daily with meals.   237 mL   12   . fentaNYL (DURAGESIC - DOSED MCG/HR) 25 MCG/HR patch   Transdermal   Place 1 patch (25 mcg total) onto the skin every 3 (three) days.   5 patch  0   . folic acid (FOLVITE) A999333 MCG tablet   Oral   Take 800 mcg by mouth daily.         . furosemide (LASIX) 40 MG tablet   Oral   Take 0.5 tablets (20 mg total) by mouth daily.   30 tablet   0   . levETIRAcetam (KEPPRA) 500 MG tablet   Oral   Take 500 mg by mouth 2 (two) times daily.         . magnesium oxide (MAG-OX) 400 MG tablet   Oral   Take 400 mg by mouth daily.         . Multiple Vitamin (MULTIVITAMIN) tablet   Oral   Take 1 tablet by mouth daily.         . ondansetron (ZOFRAN) 4 MG tablet   Oral   Take 1 tablet (4 mg total) by mouth every 8 (eight) hours as needed for nausea or vomiting.   20 tablet   0   . predniSONE (DELTASONE) 5 MG tablet   Oral   Take 1 tablet (5 mg total) by mouth daily.   7 tablet   0   . QUEtiapine (SEROQUEL) 25 MG tablet   Oral   Take 1 tablet (25 mg total) by mouth at bedtime.   30 tablet   0   . sertraline (ZOLOFT) 100 MG tablet   Oral   Take 100 mg by mouth at bedtime.         . thiamine (VITAMIN  B-1) 100 MG tablet   Oral   Take 100 mg by mouth daily.         . vitamin B-12 (CYANOCOBALAMIN) 1000 MCG tablet   Oral   Take 1,000 mcg by mouth daily.           Allergies Geodon and Haldol  Family History  Problem Relation Age of Onset  . Diabetes Mother   . Lung cancer Father     Social History Social History  Substance Use Topics  . Smoking status: Current Every Day Smoker -- 1.00 packs/day    Types: Cigarettes  . Smokeless tobacco: Not on file  . Alcohol Use: Yes     Comment: twice a month     Review of Systems   Unable to obtain review of systems due to patient being a poor historian and without anyone accompanying from her assisted living facility.  ____________________________________________   PHYSICAL EXAM:  VITAL SIGNS: ED Triage Vitals  Enc Vitals Group     BP 12/28/2015 1851 142/78 mmHg     Pulse Rate 12/26/2015 1851 100     Resp 12/28/2015 1851 18     Temp 01/01/2016 1851 98.6 F (37 C)     Temp Source 12/13/2015 1851 Oral     SpO2 01/11/2016 1851 93 %     Weight --      Height --      Head Cir --      Peak Flow --      Pain Score --      Pain Loc --      Pain Edu? --      Excl. in [place]? --     Constitutional: Sleeping in exam bed. When woken, patient was agitated, stating "I want to sleep".  Eyes: Conjunctivae are normal. PERRL.   Head: Atraumatic. Mouth/Throat: Lips dry. Thick yellow, blood tinged sputum from mouth.  Neck: No stridor. No cervical spine tenderness to palpation.  Hematological/Lymphatic/Immunilogical:  No cervical lymphadenopathy. Cardiovascular: Normal rate, regular rhythm.  Good peripheral circulation. Respiratory: Normal respiratory effort without tachypnea or retractions. No wheeze auscultated. Musculoskeletal: No tenderness noted to palpation of right hip. Patient laying on left hip without visual pain. When patient was agitated, she was moving bilateral lower extremities without pain.  Skin:  Skin is warm, dry and intact. No  rash noted. Psychiatric: Poor historian. Aggititated.    ____________________________________________   LABS (all labs ordered are listed, but only abnormal results are displayed)  Labs Reviewed - No data to display ____________________________________________  EKG  None ____________________________________________  RADIOLOGY  ____________________________________________    PROCEDURES  Procedure(s) performed: None      Medications - No data to display   ____________________________________________   INITIAL IMPRESSION / ASSESSMENT AND PLAN / ED COURSE  Patient is being transferred to a physician within the main ED. Orders and care discussed with Dr. Eula Listen.      ____________________________________________  FINAL CLINICAL IMPRESSION(S) / ED DIAGNOSES  Final diagnoses:  None      NEW MEDICATIONS STARTED DURING THIS VISIT:  New Prescriptions   No medications on file         Braxton Feathers, PA-C 12/19/2015 2100  [place], PA-C 01/10/2016 2102  Eula Listen, MD Jan 13, 2016 1534

## 2015-12-12 NOTE — ED Notes (Addendum)
Pt from [facility]. Pt arrived via [facility]. Pt states that she fell. Reports pain in left hip from previous hip surgery but denies any new pain.  Reports when she fell she hit her right elbow.  Denies pain. Denies hitting her head.  Pt is poor historian and difficult to obtain information from. EMS reports that pt did complain on right hip pain . Per EMS, [facility] reports that patient has left hip pain. Pt is sitting with legs crossed in chair without discomfort.

## 2015-12-12 NOTE — ED Provider Notes (Signed)
60 y.o. female nursing home resident with a history of alcoholic psychosis, CVA, CHF, throat cancer status post G-tube sent to the emergency department for an unwitnessed fall with left hip pain.  Per nurse, the patient had an unwitnessed fall, found on the ground, and c/o L hip pain.  The pt's baseline is that she "sleeps all the time," but when she is awake, she is usually alert and talkative, and she was initially admitted for alcoholic psychosis.  She intermittently is oriented, and she does walk w/o assistance.  She was treated for pna, last dose last week.  During the day, she has been found emptying the contents of her stomach from her Gtube into the trash can.  On my exam, the patient does not answer any questions and she does not follow commands. She had a single reading a blood pressure of 88/61 which resolved to 107/79 with a small amount of fluid. I am concerned that she is not at her baseline mental status, NL evaluate her for intracranial bleed or CVA, infection including pneumonia or urinary tract infection, rule out bacteremia, evaluate for hyperammonemia. She will need to be admitted to the hospital.  Eula Listen, MD 12/16/2015 2226

## 2015-12-12 NOTE — ED Notes (Addendum)
Pt denies all pain, pt sleeping, RN and PA woke pt and tried to get pt out of bed but pt is not following commands states"I just want to sleep, I'm fine." . Pt will not get out of bed for RN and PA. Pt has obvious sputum on sheets, no deformity or swelling noted to hip or elbow. Pt uncooperative to assess.

## 2015-12-13 ENCOUNTER — Inpatient Hospital Stay: Payer: Medicare Other

## 2015-12-13 ENCOUNTER — Encounter: Payer: Self-pay | Admitting: Internal Medicine

## 2015-12-13 DIAGNOSIS — F329 Major depressive disorder, single episode, unspecified: Secondary | ICD-10-CM | POA: Diagnosis present

## 2015-12-13 DIAGNOSIS — I5032 Chronic diastolic (congestive) heart failure: Secondary | ICD-10-CM | POA: Diagnosis present

## 2015-12-13 DIAGNOSIS — Y95 Nosocomial condition: Secondary | ICD-10-CM | POA: Diagnosis not present

## 2015-12-13 DIAGNOSIS — Z515 Encounter for palliative care: Secondary | ICD-10-CM | POA: Diagnosis present

## 2015-12-13 DIAGNOSIS — E860 Dehydration: Secondary | ICD-10-CM | POA: Diagnosis present

## 2015-12-13 DIAGNOSIS — A419 Sepsis, unspecified organism: Secondary | ICD-10-CM | POA: Diagnosis present

## 2015-12-13 DIAGNOSIS — G9341 Metabolic encephalopathy: Secondary | ICD-10-CM | POA: Diagnosis present

## 2015-12-13 DIAGNOSIS — R4182 Altered mental status, unspecified: Secondary | ICD-10-CM | POA: Diagnosis present

## 2015-12-13 DIAGNOSIS — Z951 Presence of aortocoronary bypass graft: Secondary | ICD-10-CM | POA: Diagnosis not present

## 2015-12-13 DIAGNOSIS — J189 Pneumonia, unspecified organism: Secondary | ICD-10-CM | POA: Diagnosis present

## 2015-12-13 DIAGNOSIS — I959 Hypotension, unspecified: Secondary | ICD-10-CM | POA: Diagnosis present

## 2015-12-13 DIAGNOSIS — J9600 Acute respiratory failure, unspecified whether with hypoxia or hypercapnia: Secondary | ICD-10-CM | POA: Diagnosis present

## 2015-12-13 DIAGNOSIS — M25552 Pain in left hip: Secondary | ICD-10-CM | POA: Diagnosis present

## 2015-12-13 DIAGNOSIS — E785 Hyperlipidemia, unspecified: Secondary | ICD-10-CM | POA: Diagnosis present

## 2015-12-13 DIAGNOSIS — E43 Unspecified severe protein-calorie malnutrition: Secondary | ICD-10-CM | POA: Diagnosis present

## 2015-12-13 DIAGNOSIS — I429 Cardiomyopathy, unspecified: Secondary | ICD-10-CM | POA: Diagnosis present

## 2015-12-13 DIAGNOSIS — Z85819 Personal history of malignant neoplasm of unspecified site of lip, oral cavity, and pharynx: Secondary | ICD-10-CM | POA: Diagnosis not present

## 2015-12-13 DIAGNOSIS — F17210 Nicotine dependence, cigarettes, uncomplicated: Secondary | ICD-10-CM | POA: Diagnosis present

## 2015-12-13 DIAGNOSIS — Z8541 Personal history of malignant neoplasm of cervix uteri: Secondary | ICD-10-CM | POA: Diagnosis not present

## 2015-12-13 DIAGNOSIS — Z79899 Other long term (current) drug therapy: Secondary | ICD-10-CM | POA: Diagnosis not present

## 2015-12-13 DIAGNOSIS — Z931 Gastrostomy status: Secondary | ICD-10-CM | POA: Diagnosis not present

## 2015-12-13 DIAGNOSIS — W19XXXA Unspecified fall, initial encounter: Secondary | ICD-10-CM | POA: Diagnosis present

## 2015-12-13 DIAGNOSIS — D638 Anemia in other chronic diseases classified elsewhere: Secondary | ICD-10-CM | POA: Diagnosis present

## 2015-12-13 DIAGNOSIS — Z7902 Long term (current) use of antithrombotics/antiplatelets: Secondary | ICD-10-CM | POA: Diagnosis not present

## 2015-12-13 DIAGNOSIS — Z8673 Personal history of transient ischemic attack (TIA), and cerebral infarction without residual deficits: Secondary | ICD-10-CM | POA: Diagnosis not present

## 2015-12-13 DIAGNOSIS — Z833 Family history of diabetes mellitus: Secondary | ICD-10-CM | POA: Diagnosis not present

## 2015-12-13 DIAGNOSIS — Z801 Family history of malignant neoplasm of trachea, bronchus and lung: Secondary | ICD-10-CM | POA: Diagnosis not present

## 2015-12-13 DIAGNOSIS — I469 Cardiac arrest, cause unspecified: Secondary | ICD-10-CM | POA: Diagnosis not present

## 2015-12-13 DIAGNOSIS — Z9119 Patient's noncompliance with other medical treatment and regimen: Secondary | ICD-10-CM | POA: Diagnosis not present

## 2015-12-13 DIAGNOSIS — I248 Other forms of acute ischemic heart disease: Secondary | ICD-10-CM | POA: Diagnosis present

## 2015-12-13 LAB — GLUCOSE, CAPILLARY
GLUCOSE-CAPILLARY: 70 mg/dL (ref 65–99)
GLUCOSE-CAPILLARY: 93 mg/dL (ref 65–99)
Glucose-Capillary: 73 mg/dL (ref 65–99)

## 2015-12-13 LAB — CBC WITH DIFFERENTIAL/PLATELET
BASOS ABS: 0.1 10*3/uL (ref 0–0.1)
Basophils Relative: 1 %
EOS PCT: 1 %
Eosinophils Absolute: 0.1 10*3/uL (ref 0–0.7)
HEMATOCRIT: 26.4 % — AB (ref 35.0–47.0)
Hemoglobin: 8.3 g/dL — ABNORMAL LOW (ref 12.0–16.0)
LYMPHS ABS: 0.5 10*3/uL — AB (ref 1.0–3.6)
LYMPHS PCT: 5 %
MCH: 22.9 pg — AB (ref 26.0–34.0)
MCHC: 31.3 g/dL — ABNORMAL LOW (ref 32.0–36.0)
MCV: 73.1 fL — AB (ref 80.0–100.0)
MONO ABS: 1 10*3/uL — AB (ref 0.2–0.9)
Monocytes Relative: 10 %
NEUTROS ABS: 8.8 10*3/uL — AB (ref 1.4–6.5)
Neutrophils Relative %: 83 %
Platelets: 294 10*3/uL (ref 150–440)
RBC: 3.61 MIL/uL — AB (ref 3.80–5.20)
RDW: 18.7 % — AB (ref 11.5–14.5)
WBC: 10.5 10*3/uL (ref 3.6–11.0)

## 2015-12-13 LAB — COMPREHENSIVE METABOLIC PANEL
ALT: 9 U/L — AB (ref 14–54)
ANION GAP: 5 (ref 5–15)
AST: 18 U/L (ref 15–41)
Albumin: 2 g/dL — ABNORMAL LOW (ref 3.5–5.0)
Alkaline Phosphatase: 52 U/L (ref 38–126)
BILIRUBIN TOTAL: 0.3 mg/dL (ref 0.3–1.2)
BUN: 18 mg/dL (ref 6–20)
CALCIUM: 10.5 mg/dL — AB (ref 8.9–10.3)
CO2: 20 mmol/L — AB (ref 22–32)
Chloride: 113 mmol/L — ABNORMAL HIGH (ref 101–111)
Creatinine, Ser: 0.85 mg/dL (ref 0.44–1.00)
GFR calc Af Amer: >60 mL/min (ref >60)
GFR calc non Af Amer: >60 mL/min (ref >60)
GLUCOSE: 117 mg/dL — AB (ref 65–99)
Potassium: 3.7 mmol/L (ref 3.5–5.1)
Sodium: 138 mmol/L (ref 135–145)
TOTAL PROTEIN: 6.1 g/dL — AB (ref 6.5–8.1)

## 2015-12-13 LAB — TROPONIN I
Troponin I: 0.07 ng/mL — ABNORMAL HIGH (ref <0.031)
Troponin I: 0.08 ng/mL — ABNORMAL HIGH (ref <0.031)
Troponin I: 0.06 ng/mL — ABNORMAL HIGH (ref <0.031)

## 2015-12-13 LAB — PROTIME-INR
INR: 1.43
Prothrombin Time: 17.5 seconds — ABNORMAL HIGH (ref 11.4–15.0)

## 2015-12-13 LAB — LACTIC ACID, PLASMA
Lactic Acid, Venous: 0.8 mmol/L (ref 0.5–2.0)
Lactic Acid, Venous: 0.7 mmol/L (ref 0.5–2.0)

## 2015-12-13 LAB — APTT: APTT: 54 s — AB (ref 24–36)

## 2015-12-13 LAB — PROCALCITONIN: PROCALCITONIN: 0.14 ng/mL

## 2015-12-13 LAB — MRSA PCR SCREENING: MRSA BY PCR: POSITIVE — AB

## 2015-12-13 MED ORDER — SODIUM CHLORIDE 0.9% FLUSH: 3.0000 mL | INTRAVENOUS | Status: DC | PRN

## 2015-12-13 MED ORDER — LEVETIRACETAM 500 MG PO TABS
500.0000 mg | ORAL_TABLET | Freq: Two times a day (BID) | ORAL | Status: DC
  Filled 2015-12-13: qty 1

## 2015-12-13 MED ORDER — JEVITY 1.2 CAL PO LIQD
1000.0000 mL | ORAL | Status: DC
  Filled 2015-12-13: qty 1000

## 2015-12-13 MED ORDER — LEVETIRACETAM 100 MG/ML PO SOLN
500.0000 mg | Freq: Two times a day (BID) | ORAL | Status: DC
  Administered 2015-12-13 – 2015-12-16 (×6): 500 mg
  Filled 2015-12-13 (×22): qty 5

## 2015-12-13 MED ORDER — ACETAMINOPHEN 325 MG PO TABS
650.0000 mg | ORAL_TABLET | Freq: Four times a day (QID) | ORAL | Status: DC | PRN
Start: 2015-12-13 — End: 2015-12-17

## 2015-12-13 MED ORDER — ACETAMINOPHEN 650 MG RE SUPP: 650.0000 mg | Freq: Four times a day (QID) | RECTAL | Status: DC | PRN

## 2015-12-13 MED ORDER — SODIUM CHLORIDE 0.9 % IV BOLUS (SEPSIS)
500.0000 mL | INTRAVENOUS | Status: AC
  Administered 2015-12-13: 500 mL via INTRAVENOUS

## 2015-12-13 MED ORDER — SODIUM CHLORIDE 0.9% FLUSH
3.0000 mL | Freq: Two times a day (BID) | INTRAVENOUS | Status: DC
  Administered 2015-12-13 (×2): 3 mL via INTRAVENOUS

## 2015-12-13 MED ORDER — ADULT MULTIVITAMIN W/MINERALS CH
1.0000 | ORAL_TABLET | Freq: Every day | ORAL | Status: DC
  Administered 2015-12-13 – 2015-12-16 (×4): 1
  Filled 2015-12-13 (×4): qty 1

## 2015-12-13 MED ORDER — CLOPIDOGREL BISULFATE 75 MG PO TABS
75.0000 mg | ORAL_TABLET | Freq: Every day | ORAL | Status: DC
  Administered 2015-12-13 – 2015-12-16 (×4): 75 mg
  Filled 2015-12-13 (×4): qty 1

## 2015-12-13 MED ORDER — SODIUM CHLORIDE 0.9 % IV SOLN
INTRAVENOUS | Status: DC
  Administered 2015-12-13 – 2015-12-16 (×4): via INTRAVENOUS

## 2015-12-13 MED ORDER — VANCOMYCIN HCL IN DEXTROSE 750-5 MG/150ML-% IV SOLN
750.0000 mg | INTRAVENOUS | Status: DC
  Administered 2015-12-13 – 2015-12-16 (×4): 750 mg via INTRAVENOUS
  Filled 2015-12-13 (×5): qty 150

## 2015-12-13 MED ORDER — SODIUM CHLORIDE 0.9 % IV BOLUS (SEPSIS)
1000.0000 mL | Freq: Once | INTRAVENOUS | Status: AC
  Administered 2015-12-13: 1000 mL via INTRAVENOUS

## 2015-12-13 MED ORDER — SERTRALINE HCL 50 MG PO TABS
100.0000 mg | ORAL_TABLET | Freq: Every day | ORAL | Status: DC
  Administered 2015-12-14 – 2015-12-16 (×3): 100 mg
  Filled 2015-12-13 (×3): qty 2

## 2015-12-13 MED ORDER — ONDANSETRON HCL 4 MG PO TABS: 4.0000 mg | ORAL_TABLET | Freq: Four times a day (QID) | ORAL | Status: DC | PRN

## 2015-12-13 MED ORDER — MUPIROCIN 2 % EX OINT
1.0000 "application " | TOPICAL_OINTMENT | Freq: Two times a day (BID) | CUTANEOUS | Status: DC
  Administered 2015-12-13 – 2015-12-16 (×8): 1 via NASAL
  Filled 2015-12-13: qty 22

## 2015-12-13 MED ORDER — FOLIC ACID 0.5 MG HALF TAB
500.0000 ug | ORAL_TABLET | Freq: Every day | ORAL | Status: DC
  Administered 2015-12-13 – 2015-12-16 (×4): 0.5 mg
  Filled 2015-12-13 (×6): qty 1

## 2015-12-13 MED ORDER — ENOXAPARIN SODIUM 40 MG/0.4ML ~~LOC~~ SOLN
40.0000 mg | Freq: Every day | SUBCUTANEOUS | Status: DC
  Administered 2015-12-13 – 2015-12-16 (×4): 40 mg via SUBCUTANEOUS
  Filled 2015-12-13 (×4): qty 0.4

## 2015-12-13 MED ORDER — VANCOMYCIN HCL IN DEXTROSE 1-5 GM/200ML-% IV SOLN
1000.0000 mg | Freq: Once | INTRAVENOUS | Status: AC
  Administered 2015-12-13: 1000 mg via INTRAVENOUS
  Filled 2015-12-13: qty 200

## 2015-12-13 MED ORDER — PANTOPRAZOLE SODIUM 40 MG PO TBEC
40.0000 mg | DELAYED_RELEASE_TABLET | Freq: Every day | ORAL | Status: DC
  Administered 2015-12-13: 40 mg via ORAL
  Filled 2015-12-13: qty 1

## 2015-12-13 MED ORDER — DEXTROSE 5 % IV SOLN
2.0000 g | Freq: Once | INTRAVENOUS | Status: AC
  Administered 2015-12-13: 2 g via INTRAVENOUS
  Filled 2015-12-13: qty 2

## 2015-12-13 MED ORDER — QUETIAPINE FUMARATE 25 MG PO TABS
25.0000 mg | ORAL_TABLET | Freq: Every day | ORAL | Status: DC
Start: 2015-12-13 — End: 2015-12-17
  Administered 2015-12-14 – 2015-12-16 (×3): 25 mg via ORAL
  Filled 2015-12-13 (×3): qty 1

## 2015-12-13 MED ORDER — DEXTROSE 5 % IV SOLN
2.0000 g | Freq: Two times a day (BID) | INTRAVENOUS | Status: DC
  Administered 2015-12-13 – 2015-12-14 (×2): 2 g via INTRAVENOUS
  Filled 2015-12-13 (×4): qty 2

## 2015-12-13 MED ORDER — ONDANSETRON HCL 4 MG/2ML IJ SOLN
4.0000 mg | Freq: Four times a day (QID) | INTRAMUSCULAR | Status: DC | PRN
  Filled 2015-12-13: qty 2

## 2015-12-13 MED ORDER — CHLORHEXIDINE GLUCONATE CLOTH 2 % EX PADS
6.0000 | MEDICATED_PAD | Freq: Every day | CUTANEOUS | Status: DC
  Administered 2015-12-13 – 2015-12-17 (×4): 6 via TOPICAL

## 2015-12-13 MED ORDER — VITAMIN B-12 1000 MCG PO TABS
1000.0000 ug | ORAL_TABLET | Freq: Every day | ORAL | Status: DC
  Administered 2015-12-13 – 2015-12-16 (×4): 1000 ug
  Filled 2015-12-13 (×4): qty 1

## 2015-12-13 MED ORDER — VITAMIN B-1 100 MG PO TABS
100.0000 mg | ORAL_TABLET | Freq: Every day | ORAL | Status: DC
  Administered 2015-12-13 – 2015-12-16 (×4): 100 mg
  Filled 2015-12-13 (×4): qty 1

## 2015-12-13 MED ORDER — FREE WATER
200.0000 mL | Freq: Three times a day (TID) | Status: DC
Start: 2015-12-13 — End: 2015-12-13

## 2015-12-13 NOTE — Progress Notes (Signed)
[facility] [place] NAME: [name]    MR#:  [id number]  DATE OF BIRTH:  1956-03-21  SUBJECTIVE:    Patient presented with a fall and was found to have lethargy on admission to the emergency room. She without chest pain. She does state that she feels tired. Patient denies shortness of breath or abdominal pain.  REVIEW OF SYSTEMS:    Review of Systems  Constitutional: Positive for malaise/fatigue. Negative for fever and chills.  HENT: Negative for sore throat.   Eyes: Negative for blurred vision.  Respiratory: Negative for cough, hemoptysis, shortness of breath and wheezing.   Cardiovascular: Negative for chest pain, palpitations and leg swelling.  Gastrointestinal: Negative for nausea, vomiting, abdominal pain, diarrhea and blood in stool.  Genitourinary: Negative for dysuria.  Musculoskeletal: Negative for back pain.  Neurological: Positive for weakness. Negative for dizziness, tremors and headaches.  Endo/Heme/Allergies: Does not bruise/bleed easily.    Tolerating Diet: Yes      DRUG ALLERGIES:   Allergies  Allergen Reactions  . Geodon [Ziprasidone Hcl] Other (See Comments)    Unknown reaction  . Haldol [Haloperidol] Other (See Comments)    Unknown reaction    VITALS:  Blood pressure 80/58, pulse 87, temperature 98 F (36.7 C), temperature source Oral, resp. rate 27, height 4\' 6"  (1.372 m), weight 46.96 kg (103 lb 8.5 oz), SpO2 97 %.  PHYSICAL EXAMINATION:   Physical Exam  Constitutional: She is oriented to person, place, and time. No distress.  frail  HENT:  Head: Normocephalic.  Eyes: No scleral icterus.  Neck: Normal range of motion. Neck supple. No JVD present. No tracheal deviation present.  Cardiovascular: Normal rate, regular rhythm and normal heart sounds.  Exam reveals no gallop and no friction rub.   No murmur heard. Pulmonary/Chest: Effort normal and breath sounds normal. No respiratory  distress. She has no wheezes. She has no rales. She exhibits no tenderness.  Abdominal: Soft. Bowel sounds are normal. She exhibits no distension and no mass. There is no tenderness. There is no rebound and no guarding.  Musculoskeletal: Normal range of motion. She exhibits no edema.  Neurological: She is alert and oriented to person, place, and time.  Skin: Skin is warm. No rash noted. No erythema.  Psychiatric: Affect and judgment normal.      LABORATORY PANEL:   CBC  Recent Labs Lab 12/13/15 0312  WBC 10.5  HGB 8.3*  HCT 26.4*  PLT 294   ------------------------------------------------------------------------------------------------------------------  Chemistries   Recent Labs Lab 12/13/15 0312  NA 138  K 3.7  CL 113*  CO2 20*  GLUCOSE 117*  BUN 18  CREATININE 0.85  CALCIUM 10.5*  AST 18  ALT 9*  ALKPHOS 52  BILITOT 0.3   ------------------------------------------------------------------------------------------------------------------  Cardiac Enzymes  Recent Labs Lab 12/13/15 0108 12/13/15 0700  TROPONINI 0.07* 0.08*   ------------------------------------------------------------------------------------------------------------------  RADIOLOGY:  Dg Chest 2 View  12/20/2015  CLINICAL DATA:  Pt from [facility]. Pt arrived via [facility]. Pt states that she fell. Reports pain in left hip from previous hip surgery but denies any new pain. Reports when she fell she hit her right elbow. Denies pain. Denies hitting her head. Pt is poor historian and difficult to obtain information from. EMS reports that pt did complain on right hip pain . Per EMS, [facility] reports that patient has left hip pain. Pt is sitting with legs crossed in chair without discomfort. EXAM: CHEST  2  VIEW COMPARISON:  10/03/2015 FINDINGS: There is significant opacity at the left lung base, in part due to a pleural effusion. There is associated parenchymal opacity. Pneumonia suspected.  Right lung is clear other than stable apical scarring. No right pleural effusion. No pneumothorax. Changes from cardiac surgery and valve replacement are stable. No convincing mediastinal or hilar masses. Bony thorax is demineralized but grossly intact. IMPRESSION: 1. Moderate left pleural effusion with associated left lung base opacity suspicious for pneumonia. It could be due to atelectasis, however. No evidence of pulmonary edema. Electronically Signed   By: Lajean Manes M.D.   On: 12/29/2015 22:52   Ct Head Wo Contrast  12/31/2015  CLINICAL DATA:  Failure to thrive. History of seizures and CVA. Dementia. Altered mental status. EXAM: CT HEAD WITHOUT CONTRAST TECHNIQUE: Contiguous axial images were obtained from the base of the skull through the vertex without intravenous contrast. COMPARISON:  10/03/2015 FINDINGS: The ventricles are normal in overall configuration. There is mild ex vacuo dilation of the posterior right lateral ventricle ROM encephalomalacia due to an old right temporal, parietal, occipital lobe infarct. There is generalized ventricular enlargement and sulcal enlargement reflecting moderate atrophy, advanced for age. No hydrocephalus. There are no parenchymal masses or mass effect. There are old cerebellar infarcts, larger involving the left PICA distribution. There is no evidence of a recent cortical infarct. Patchy areas of white matter hypoattenuation noted consistent with mild to moderate chronic microvascular ischemic change. There are no extra-axial masses or abnormal fluid collections. There is no intracranial hemorrhage. Visualized sinuses are clear other than minor left maxillary sinus mucosal thickening. Clear mastoid air cells. IMPRESSION: 1. No acute intracranial abnormalities. 2. Old infarcts, moderate atrophy advanced for age and mild to moderate chronic microvascular ischemic change. Electronically Signed   By: Lajean Manes M.D.   On: 01/02/2016 23:05   Dg Hip Unilat With  Pelvis 2-3 Views Left  12/14/2015  CLINICAL DATA:  60 year old female with fall and left hip pain. Stop EXAM: DG HIP (WITH OR WITHOUT PELVIS) 2-3V LEFT COMPARISON:  None FINDINGS: There is advanced osteopenia. Left femoral intra medullary fixation hardware noted. There is no evidence of acute fracture or dislocation. MR first calcification noted within the pelvis, likely related to uterine fibroids. IMPRESSION: Advanced osteopenia.  No acute fracture or dislocation. Electronically Signed   By: Anner Crete M.D.   On: 01/04/2016 22:52   Dg Hip Unilat With Pelvis 2-3 Views Right  12/21/2015  CLINICAL DATA:  Pt from [facility]. Pt arrived via [facility]. Pt states that she fell. Reports pain in left hip from previous hip surgery but denies any new pain. Reports when she fell she hit her right elbow. Denies pain. Denies hitting her head. Pt is poor historian and difficult to obtain information from. EMS reports that pt did complain on right hip pain . Per EMS, [facility] reports that patient has left hip pain. Pt is sitting with legs crossed in chair without discomfort. EXAM: DG HIP (WITH OR WITHOUT PELVIS) 2-3V RIGHT COMPARISON:  None. FINDINGS: No fracture. Old fracture of the left proximal femur has been reduced with an intra medullary rod and screw. Hip joints, SI joints and pubic symphysis are normally aligned. Bones are demineralized. Vascular calcifications are noted along the iliac and femoral vessels. IMPRESSION: 1. No acute fracture or acute finding. Electronically Signed   By: Lajean Manes M.D.   On: 12/18/2015 22:53     ASSESSMENT AND PLAN:   60 year old female with  a history of stroke, chronic diastolic congestive heart failure and seizure disorder who presents from [facility] after an unwitnessed fall.  1. Acute metabolic encephalopathy: This is due to left lung pneumonia. Patient's mental status is improving. Continue treatment for pneumonia.  2.HCAP: Continue cefepime and  vancomycin. Follow up on blood cultures.  3. Elevated troponin: This is due to demand ischemia and not ACS.  4. Accidental fall: Patient will need physical therapy consultation once medically stable. No signs of acute neurological issues. 5. Hypotension: Continue IV fluids and monitor her blood pressure. If MAP falls below 65 and will need to add pressors.  6. Chronic diastolic heart failure: No acute exacerbation.   7. History of stroke: Continue Plavix   8. History of seizure: Continue Keppra   9. Management plans discussed with the patient and she is in agreement.  CODE STATUS: FULL CRITICAL CARETOTAL TIME TAKING CARE OF THIS PATIENT: 30 minutes.    Due to low blood pressure patient may remain in step down and critically ill POSSIBLE D/C 2-3 days, DEPENDING ON CLINICAL CONDITION.   Valerio Pinard M.D on 12/13/2015 at 12:35 PM  Between 7am to 6pm - Pager - [phone] After 6pm go to [email or web address] - password [email or web address] [facility]  Office  [phone]  CC: Primary care physician; Juluis Pitch, MD  Note: This dictation was prepared with Dragon dictation along with smaller phrase technology. Any transcriptional errors that result from this process are unintentional.

## 2015-12-13 NOTE — H&P (Signed)
[facility] NAME: [name]    MR#:  [id number]  DATE OF BIRTH:  [birth year]  DATE OF ADMISSION:  12/21/2015  PRIMARY CARE PHYSICIAN: Juluis Pitch, MD   REQUESTING/REFERRING PHYSICIAN:   CHIEF COMPLAINT:   Chief Complaint  Patient presents with  . Fall    HISTORY OF PRESENT ILLNESS: [name]  is a 60 y.o. female with a known history of CVA, congestive heart failure, alcohol abuse, cardiomyopathy, seizure disorder, head and neck cancer was referred from [facility] facility after she was found on the floor. Patient had an unwitnessed fall. Patient is lethargic in the emergency room with decreased responsiveness. Facility notes were reviewed. Patient was worked up with a CT head in the emergency room no acute intracranial abnormality was noted. She was hypotensive and was recently treated for pneumonia week ago. In the nursing home patient empties her gastric tube feedings frequently. She walks with assistance and sleeps most of the time. Imaging studies of the hip did not reveal any fracture. Chest x-ray revealed left lung pneumonia. Not much history could be obtained from the patient. She was arousable to loud verbal commands and painful stimuli and not completely oriented.  PAST MEDICAL HISTORY:   Past Medical History  Diagnosis Date  . ETOH abuse   . CVA (cerebral infarction)   . Acute CHF (congestive heart failure) ([place])   . Stress-induced cardiomyopathy   . Clostridium difficile colitis   . Seizures ([place])   . Hyperlipidemia   . Cancer ([place])   . Cervical cancer ([place])   . Throat cancer ([place])     PAST SURGICAL HISTORY: Past Surgical History  Procedure Laterality Date  . Coronary artery bypass graft    . Colonoscopy with propofol N/A 04/23/2015    Procedure: COLONOSCOPY WITH PROPOFOL;  Surgeon: Manya Silvas, MD;  Location: [facility] ENDOSCOPY;  Service: Endoscopy;  Laterality: N/A;  . 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 Smoking status: Current Every Day Smoker -- 1.00 packs/day    Types: Cigarettes  . Smokeless tobacco: Not on file  . Alcohol Use: 0.0 oz/week    0 Standard drinks or equivalent per week     Comment: twice a month    FAMILY HISTORY:  Family History  Problem Relation Age of Onset  . Diabetes Mother   . Lung cancer Father     DRUG ALLERGIES:  Allergies  Allergen Reactions  . Geodon [Ziprasidone Hcl] Other (See Comments)    Unknown reaction  . Haldol [Haloperidol] Other (See Comments)    Unknown reaction    REVIEW OF SYSTEMS:  Lethargic,confused. Not completely oriented.  MEDICATIONS AT HOME:  Prior to Admission medications   Medication Sig Start Date End Date Taking? Authorizing Provider  acidophilus (RISAQUAD) CAPS capsule Take 2 capsules by mouth 2 (two) times daily with a meal. Patient taking differently: Place 1 capsule into feeding tube at bedtime.  02/27/15  Yes Fritzi Mandes, MD  clopidogrel (PLAVIX) 75 MG tablet Place 75 mg into feeding tube daily.    Yes Historical Provider, MD  folic acid (FOLVITE) [id number] MCG tablet Place 400 mcg into feeding tube daily.    Yes Historical Provider, MD  levETIRAcetam (KEPPRA) 500 MG tablet Place 500 mg into feeding tube  2 (two) times daily. 500mg /22mL solution   Yes Historical Provider, MD  Multiple Vitamin (MULTIVITAMIN) tablet Place 1 tablet into feeding tube daily.    Yes Historical Provider, MD  ondansetron (ZOFRAN) 4 MG tablet Take 1 tablet (4 mg total) by mouth every 8 (eight) hours as needed for nausea or vomiting. Patient taking differently: Place 4 mg into feeding tube every 8 (eight) hours as needed for nausea or vomiting.  10/06/15  Yes Dustin Flock, MD  pantoprazole (PROTONIX) 40 MG tablet Place 40 mg into feeding tube daily.   Yes  Historical Provider, MD  potassium chloride 20 MEQ/15ML (10%) SOLN Place 20 mEq into feeding tube daily.   Yes Historical Provider, MD  senna (SENOKOT) 8.6 MG TABS tablet Place 1 tablet into feeding tube daily.   Yes Historical Provider, MD  sertraline (ZOLOFT) 100 MG tablet Place 100 mg into feeding tube at bedtime.    Yes Historical Provider, MD  thiamine (VITAMIN B-1) 100 MG tablet Place 100 mg into feeding tube daily.    Yes Historical Provider, MD  vitamin B-12 (CYANOCOBALAMIN) 1000 MCG tablet Place 1,000 mcg into feeding tube daily.    Yes Historical Provider, MD  clonazePAM (KLONOPIN) 0.5 MG tablet Take 1 tablet (0.5 mg total) by mouth 3 (three) times daily. Patient not taking: Reported on 12/13/2015 10/06/15   Dustin Flock, MD  fentaNYL (DURAGESIC - DOSED MCG/HR) 25 MCG/HR patch Place 1 patch (25 mcg total) onto the skin every 3 (three) days. Patient not taking: Reported on 12/22/2015 10/06/15   Dustin Flock, MD  furosemide (LASIX) 40 MG tablet Take 0.5 tablets (20 mg total) by mouth daily. Patient not taking: Reported on 01/02/2016 02/27/15   Fritzi Mandes, MD  QUEtiapine (SEROQUEL) 25 MG tablet Take 1 tablet (25 mg total) by mouth at bedtime. Patient not taking: Reported on 01/06/2016 10/06/15   Dustin Flock, MD      PHYSICAL EXAMINATION:   VITAL SIGNS: Blood pressure 93/62, pulse 95, temperature 98.6 F (37 C), temperature source Oral, resp. rate 17, SpO2 96 %.  GENERAL:  60 y.o.-year-old patient lying in the bed in mild distress.  EYES: Pupils equal, round, reactive to light and accommodation. No scleral icterus. Extraocular muscles intact.  HEENT: Head atraumatic, normocephalic. Oropharynx dry and nasopharynx clear.  NECK:  Supple, no jugular venous distention. No thyroid enlargement, no tenderness.  LUNGS: Decreased breath sounds bilaterally, no wheezing, rales at left lung base ,no rhonchi or crepitation. No use of accessory muscles of respiration.  CARDIOVASCULAR: S1, S2 normal.  No murmurs, rubs, or gallops.  ABDOMEN: Soft, nontender, nondistended. Bowel sounds present. No organomegaly or mass. PEG tube noted. EXTREMITIES: No pedal edema, cyanosis, or clubbing.  NEUROLOGIC: Not oriented to time, place and person. PSYCHIATRIC: could not be assessed SKIN: No obvious rash, lesion, or ulcer.   LABORATORY PANEL:   CBC  Recent Labs Lab 01/10/2016 2118  WBC 14.0*  HGB 9.1*  HCT 28.7*  PLT 328  MCV 72.6*  MCH 23.1*  MCHC 31.8*  RDW 18.5*  LYMPHSABS 0.7*  MONOABS 1.2*  EOSABS 0.0  BASOSABS 0.0   ------------------------------------------------------------------------------------------------------------------  Chemistries   Recent Labs Lab 12/15/2015 2118  NA 138  K 3.9  CL 107  CO2 23  GLUCOSE 97  BUN 21*  CREATININE 0.92  CALCIUM 11.5*  AST 22  ALT 12*  ALKPHOS 59  BILITOT 0.3   ------------------------------------------------------------------------------------------------------------------ CrCl cannot be calculated (Unknown ideal weight.). ------------------------------------------------------------------------------------------------------------------ No results for input(s): TSH, T4TOTAL, T3FREE, THYROIDAB in  the last 72 hours.  Invalid input(s): FREET3   Coagulation profile No results for input(s): INR, PROTIME in the last 168 hours. ------------------------------------------------------------------------------------------------------------------- No results for input(s): DDIMER in the last 72 hours. -------------------------------------------------------------------------------------------------------------------  Cardiac Enzymes No results for input(s): CKMB, TROPONINI, MYOGLOBIN in the last 168 hours.  Invalid input(s): CK ------------------------------------------------------------------------------------------------------------------ Invalid input(s):  POCBNP  ---------------------------------------------------------------------------------------------------------------  Urinalysis    Component Value Date/Time   COLORURINE YELLOW* 01/07/2016 2118   COLORURINE Yellow 11/20/2014 0359   APPEARANCEUR HAZY* 12/16/2015 2118   APPEARANCEUR Clear 11/20/2014 0359   LABSPEC 1.014 12/20/2015 2118   LABSPEC 1.014 11/20/2014 0359   PHURINE 5.0 12/14/2015 2118   PHURINE 5.0 11/20/2014 0359   GLUCOSEU NEGATIVE 12/18/2015 2118   GLUCOSEU Negative 11/20/2014 0359   HGBUR NEGATIVE 01/11/2016 2118   HGBUR 1+ 11/20/2014 0359   BILIRUBINUR NEGATIVE 12/26/2015 2118   BILIRUBINUR Negative 11/20/2014 0359   KETONESUR NEGATIVE 01/01/2016 2118   KETONESUR Negative 11/20/2014 0359   PROTEINUR NEGATIVE 12/20/2015 2118   PROTEINUR 100 mg/dL 11/20/2014 0359   UROBILINOGEN 0.2 11/26/2014 0804   NITRITE NEGATIVE 01/08/2016 2118   NITRITE Negative 11/20/2014 0359   LEUKOCYTESUR NEGATIVE 12/23/2015 2118   LEUKOCYTESUR Negative 11/20/2014 0359     RADIOLOGY: Dg Chest 2 View  12/19/2015  CLINICAL DATA:  Pt from [facility] Pt arrived via [facility]. Pt states that she fell. Reports pain in left hip from previous hip surgery but denies any new pain. Reports when she fell she hit her right elbow. Denies pain. Denies hitting her head. Pt is poor historian and difficult to obtain information from. EMS reports that pt did complain on right hip pain . Per EMS, [facility] reports that patient has left hip pain. Pt is sitting with legs crossed in chair without discomfort. EXAM: CHEST  2 VIEW COMPARISON:  10/03/2015 FINDINGS: There is significant opacity at the left lung base, in part due to a pleural effusion. There is associated parenchymal opacity. Pneumonia suspected. Right lung is clear other than stable apical scarring. No right pleural effusion. No pneumothorax. Changes from cardiac surgery and valve replacement are stable. No convincing mediastinal or hilar  masses. Bony thorax is demineralized but grossly intact. IMPRESSION: 1. Moderate left pleural effusion with associated left lung base opacity suspicious for pneumonia. It could be due to atelectasis, however. No evidence of pulmonary edema. Electronically Signed   By: Lajean Manes M.D.   On: 01/11/2016 22:52   Ct Head Wo Contrast  12/15/2015  CLINICAL DATA:  Failure to thrive. History of seizures and CVA. Dementia. Altered mental status. EXAM: CT HEAD WITHOUT CONTRAST TECHNIQUE: Contiguous axial images were obtained from the base of the skull through the vertex without intravenous contrast. COMPARISON:  10/03/2015 FINDINGS: The ventricles are normal in overall configuration. There is mild ex vacuo dilation of the posterior right lateral ventricle ROM encephalomalacia due to an old right temporal, parietal, occipital lobe infarct. There is generalized ventricular enlargement and sulcal enlargement reflecting moderate atrophy, advanced for age. No hydrocephalus. There are no parenchymal masses or mass effect. There are old cerebellar infarcts, larger involving the left PICA distribution. There is no evidence of a recent cortical infarct. Patchy areas of white matter hypoattenuation noted consistent with mild to moderate chronic microvascular ischemic change. There are no extra-axial masses or abnormal fluid collections. There is no intracranial hemorrhage. Visualized sinuses are clear other than minor left maxillary sinus mucosal thickening. Clear mastoid air cells. IMPRESSION: 1. No acute intracranial abnormalities. 2. Old  infarcts, moderate atrophy advanced for age and mild to moderate chronic microvascular ischemic change. Electronically Signed   By: Lajean Manes M.D.   On: 12/30/2015 23:05   Dg Hip Unilat With Pelvis 2-3 Views Left  12/16/2015  CLINICAL DATA:  60 year old female with fall and left hip pain. Stop EXAM: DG HIP (WITH OR WITHOUT PELVIS) 2-3V LEFT COMPARISON:  None FINDINGS: There is advanced  osteopenia. Left femoral intra medullary fixation hardware noted. There is no evidence of acute fracture or dislocation. MR first calcification noted within the pelvis, likely related to uterine fibroids. IMPRESSION: Advanced osteopenia.  No acute fracture or dislocation. Electronically Signed   By: Anner Crete M.D.   On: 01/06/2016 22:52   Dg Hip Unilat With Pelvis 2-3 Views Right  12/22/2015  CLINICAL DATA:  Pt from [facility]. Pt arrived via [facility]. Pt states that she fell. Reports pain in left hip from previous hip surgery but denies any new pain. Reports when she fell she hit her right elbow. Denies pain. Denies hitting her head. Pt is poor historian and difficult to obtain information from. EMS reports that pt did complain on right hip pain . Per EMS, [facility] reports that patient has left hip pain. Pt is sitting with legs crossed in chair without discomfort. EXAM: DG HIP (WITH OR WITHOUT PELVIS) 2-3V RIGHT COMPARISON:  None. FINDINGS: No fracture. Old fracture of the left proximal femur has been reduced with an intra medullary rod and screw. Hip joints, SI joints and pubic symphysis are normally aligned. Bones are demineralized. Vascular calcifications are noted along the iliac and femoral vessels. IMPRESSION: 1. No acute fracture or acute finding. Electronically Signed   By: Lajean Manes M.D.   On: 12/18/2015 22:53    EKG: Orders placed or performed during the hospital encounter of 02/25/15  . ED EKG  . ED EKG  . EKG 12-Lead  . EKG 12-Lead  . EKG    IMPRESSION AND PLAN: 60 year old female patient with history of CVA, head and neck cancer, congestive heart failure, PEG tube, cardiomyopathy presented to the emergency room for confusion and fall. Admitting diagnosis 1. Altered mental status 2. Left lung pneumonia 3. Hypotension 4. Systemic inflammatory response syndrome 5. History of head and neck cancer 6. Accidental fall 7.Dehydration. Treatment plan Admit patient to  telemetry IV fluid hydration Start patient on broad-spectrum IV antibiotics Follow-up culture Hold diuretics for now Oxygen via nasal cannula Monitor electrolytes Supportive care.  All the records are reviewed and case discussed with ED provider. Management plans discussed with the patient, family and they are in agreement.  CODE STATUS:FULL    Code Status Orders        Start     Ordered   12/13/15 0040  Full code   Continuous     12/13/15 0043    Code Status History    Date Active Date Inactive Code Status Order ID Comments User Context   10/03/2015  6:59 AM 10/06/2015  2:22 PM Full Code [id number]  Harrie Foreman, MD Inpatient   02/25/2015  9:39 PM 02/27/2015  4:44 PM Full Code [id number]  Marybelle Killings, MD Inpatient   02/25/2015  2:37 PM 02/25/2015  9:39 PM Full Code [id number]  Vaughan Basta, MD Inpatient   11/24/2014  4:38 PM 01/01/2015  9:14 PM Full Code [id number]  Gordan Payment, CNA Inpatient       TOTAL TIME TAKING CARE OF THIS PATIENT: 51 minutes.    Pavan Pyreddy M.D  on 12/13/2015 at 12:46 AM  Between 7am to 6pm - Pager - [phone]  After 6pm go to [email or web address] - password [email or web address] [facility]  Office  [phone]  CC: Primary care physician; Juluis Pitch, MD

## 2015-12-13 NOTE — Progress Notes (Signed)
Patient arrived to unit. A&O. VSS. Cough present with clear sputum. PEG tube in place. Katherine with speech therapy informed us that the patient is NPO here and at baseline with "pleaseure bites". She also explained that the patient's esophagus is not functioning properly causing everything in the stomach to come back up and causing the patient to vomit. Belenda Cruise also explained that she was afraid the tube feeding ordered for the patient will cause the same issue and cause the patient to vomit. Will continue to monitor while waiting for jevity tube feed to arrive.  Almedia Balls, RN

## 2015-12-13 NOTE — ED Notes (Signed)
Lab called to report high troponin of 0.07.Dr. Estanislado Pandy notified.

## 2015-12-13 NOTE — Progress Notes (Signed)
Pt remains A & O to person and place with complaints of left hip pain with movement. XRay was negative for fx. Lung sounds are clear to auscultation; however, the pt has a strong productive cough with clear, thick mucus. Chest XRay was conclusive of left lung pnu and antibiotics are scheduled. Blood cx were obtained today. Pt has been ordered tube feedings that will begin shortly. Transfer orders have been placed for the pt to move to med-surg-report given to [place], Therapist, sports.

## 2015-12-13 NOTE — Progress Notes (Signed)
Pt lying in bed AAox3 pt has mild intermittent pain to L hip . Pt has IVF's infusing pt admitted for sepsis with hypotension. Pt has had marginal blood pressure since arrival to unit. MRSA PCR positive .Marland Kitchen

## 2015-12-13 NOTE — Progress Notes (Signed)
Initial Nutrition Assessment    INTERVENTION:   Coordination of Care: discussed nutritional poc with MD Mody; received verbal order to restart TF via PEG tube. Recommend starting Jevity 1.2 at rate of  20 ml/hr with goal of 45 ml/hr meeting 100% estimated calorie and protein needs (TwoCal not on hospital formulary); also discussed plan regarding diet. MD gave order for SLP evaluation. Also expressed concern regarding pt disconnecting feeding tube from feeding and emptying contents from tube into trash can; MD plans to consult palliative care to further determine poc. All of the above discussed with Brooke RN   NUTRITION DIAGNOSIS:   Inadequate oral intake related to acute illness as evidenced by NPO status.  GOAL:   Patient will meet greater than or equal to 90% of their needs  MONITOR:    (Energy Intake, Anthropometrics, Digestive System, Electrolyte/Renal Profile)  REASON FOR ASSESSMENT:   Consult Assessment of nutrition requirement/status  ASSESSMENT:    Pt admitted s/p fall with lethargy, acute metabolic encephalopathy due to pneumonia  Past Medical History  Diagnosis Date  . ETOH abuse   . CVA (cerebral infarction)   . Acute CHF (congestive heart failure) ([place])   . Stress-induced cardiomyopathy   . Clostridium difficile colitis   . Seizures ([place])   . Hyperlipidemia   . Cancer ([place])   . Cervical cancer ([place])   . Throat cancer ([place])     Diet Order:  Diet NPO time specified   Food and Nutrition Related History: pt not very forthcoming with information with regards to nutrition but reports she was eating puree food with thickened liquids and she did not like this diet. Pt also reports she was receiving tube feeding but did not know the regimen. Spoke with staff at [facility] who report pt recently had a swallow study (within last few weeks) and it was recommended that she be NPO with pleasure po of puree, thickened liquids only. Main source of nutrition has been  TF. They report TF regimen of TwoCal HN-combination of night time feedings and bolus feedings (45 ml/hr at night from 10 pm to 6am with 120 mL bolus 2x daily-provides 1195 kcals,50 g of protein) . Staff also report that pt has been disconnecting her feeding tube and emptying contents into trash can.   Skin:  Reviewed, no issues  Last BM:  12/13/15   Digestive System: pt with 20 french gastrostomy tube   Recent Labs Lab 01/09/2016 2118 12/13/15 0312  NA 138 138  K 3.9 3.7  CL 107 113*  CO2 23 20*  BUN 21* 18  CREATININE 0.92 0.85  CALCIUM 11.5* 10.5*  GLUCOSE 97 117*    Glucose Profile:   Recent Labs  12/13/15 0306 12/13/15 0731  GLUCAP 70 93   Meds: NS at 75 ml/hr, MVI  Nutrition focused physical exam: attempted exam but unable to complete, pt lying on side and did not participate in exam. Based on partial examination, pt with mild fat loss, mild muscle wasting and no edema. Will reassess on follow-up  Height:   Ht Readings from Last 1 Encounters:  12/13/15 4\' 6"  (1.372 m)    Weight: weight is up from admission in December per wt encounters  Wt Readings from Last 1 Encounters:  12/13/15 103 lb 8.5 oz (46.96 kg)   Wt Readings from Last 10 Encounters:  12/13/15 103 lb 8.5 oz (46.96 kg)  10/06/15 96 lb 8 oz (43.772 kg)  04/23/15 100 lb (45.36 kg)  02/25/15 102 lb (  46.267 kg)    BMI:  Body mass index is 24.95 kg/(m^2).  Estimated Nutritional Needs:   Kcal:  [id number] kcals (BEE 881, 1.2 AF, 1.1-1.3 IF)   Protein:  47-56 g(1.0-1.2 g/kg)   Fluid:  1410-1645 mL (30-35 ml/kg)   HIGH Care Level  Kerman Passey MS, RD, LDN [phone] Pager  [phone] Weekend/On-Call Pager

## 2015-12-13 NOTE — Progress Notes (Signed)
Dr Benjie Karvonen notified of troponin lab value of 0.08. No new orders at this time.

## 2015-12-13 NOTE — Progress Notes (Signed)
MD notified of patient's speech evaluation. Per off going RN, patient with poor ability to managed PO intake. Also, possible back flow from PEG tube. Patient is positive PNA this admission. Orders were previously entered to initiate tube feedings. However, MD was requested to stop tube feedings and PEG intake until further evaluation is complete. Will continue to monitor.

## 2015-12-13 NOTE — Progress Notes (Signed)
Pharmacy Antibiotic Note  [name] is a 60 y.o. female admitted on 12/23/2015 with pneumonia.  Pharmacy has been consulted for cefepime and vancomycin dosing.  Plan: 1. Cefepime 2 gm IV Q12H 2. Vancomycin 1 gm IV x 1 followed by vancomycin 750 mg IV Q18H, predicted trough 18 mcg/mL. Pharmacy will continue to monitor and adjust as needed to maintain trough 15 to 20 mcg/mL.  Vd 32.9 L, Ke 0.046 hr-1, T1/2 15.1 hr  Height: 4\' 6"  (137.2 cm) Weight: 103 lb 8.5 oz (46.96 kg) IBW/kg (Calculated) : 31.7  Temp (24hrs), Avg:98.2 F (36.8 C), Min:97.8 F (36.6 C), Max:98.6 F (37 C)   Recent Labs Lab 12/30/2015 2118  WBC 14.0*  CREATININE 0.92    Estimated Creatinine Clearance: 40.3 mL/min (by C-G formula based on Cr of 0.92).    Allergies  Allergen Reactions  . Geodon [Ziprasidone Hcl] Other (See Comments)    Unknown reaction  . Haldol [Haloperidol] Other (See Comments)    Unknown reaction    Thank you for allowing pharmacy to be a part of this patient's care.  Laural Benes, Pharm.D., BCPS Clinical Pharmacist 12/13/2015 3:50 AM

## 2015-12-13 NOTE — Progress Notes (Signed)
Speech Therapy Note: received consult; reviewed chart notes. Pt was recently seen at this facility for an OP MBSS in which Severe oropharyngeal phase Dysphagia was noted in addition to observed esophageal-to-pharynx backflow. It was determined pt is "at significant risk for both prandial aspiration and aspiration of reflux. Prognosis for improved swallow safety is guarded. Recommend using PEG for nutrition, hydration, and medication. Recommend limited puree for pleasure only. Recommend follow strict aspiration and reflux precautions.". These results were discussed w/ Dietician and will be relayed to MD(conversation was initiated re: pt's dysphagia status baseline). No further skilled ST services indicated at this time. Noted the recommendation for a Palliative Care consult which would be appropriate d/t chart notes. NSG updated as well. ST services will be available for any further education on MBSS if needed.

## 2015-12-14 LAB — CBC
HEMATOCRIT: 26.2 % — AB (ref 35.0–47.0)
HEMOGLOBIN: 8.2 g/dL — AB (ref 12.0–16.0)
MCH: 23.4 pg — AB (ref 26.0–34.0)
MCHC: 31.1 g/dL — AB (ref 32.0–36.0)
MCV: 75.2 fL — AB (ref 80.0–100.0)
Platelets: 319 10*3/uL (ref 150–440)
RBC: 3.49 MIL/uL — ABNORMAL LOW (ref 3.80–5.20)
RDW: 18.8 % — ABNORMAL HIGH (ref 11.5–14.5)
WBC: 14.7 10*3/uL — ABNORMAL HIGH (ref 3.6–11.0)

## 2015-12-14 LAB — BASIC METABOLIC PANEL
Anion gap: 5 (ref 5–15)
BUN: 14 mg/dL (ref 6–20)
CHLORIDE: 114 mmol/L — AB (ref 101–111)
CO2: 19 mmol/L — AB (ref 22–32)
CREATININE: 0.91 mg/dL (ref 0.44–1.00)
Calcium: 10.7 mg/dL — ABNORMAL HIGH (ref 8.9–10.3)
GFR calc Af Amer: >60 mL/min (ref >60)
GFR calc non Af Amer: >60 mL/min (ref >60)
GLUCOSE: 78 mg/dL (ref 65–99)
Potassium: 3.5 mmol/L (ref 3.5–5.1)
SODIUM: 138 mmol/L (ref 135–145)

## 2015-12-14 LAB — GLUCOSE, CAPILLARY
GLUCOSE-CAPILLARY: 76 mg/dL (ref 65–99)
GLUCOSE-CAPILLARY: 72 mg/dL (ref 65–99)
GLUCOSE-CAPILLARY: 78 mg/dL (ref 65–99)
GLUCOSE-CAPILLARY: 87 mg/dL (ref 65–99)
Glucose-Capillary: 75 mg/dL (ref 65–99)
Glucose-Capillary: 87 mg/dL (ref 65–99)
Glucose-Capillary: 91 mg/dL (ref 65–99)

## 2015-12-14 LAB — PHOSPHORUS: Phosphorus: 2.9 mg/dL (ref 2.5–4.6)

## 2015-12-14 LAB — MAGNESIUM: Magnesium: 1.6 mg/dL — ABNORMAL LOW (ref 1.7–2.4)

## 2015-12-14 MED ORDER — FENTANYL 25 MCG/HR TD PT72
25.0000 ug | MEDICATED_PATCH | TRANSDERMAL | Status: DC
Start: 2015-12-14 — End: 2015-12-17
  Administered 2015-12-14: 25 ug via TRANSDERMAL
  Filled 2015-12-14: qty 1

## 2015-12-14 MED ORDER — MAGNESIUM SULFATE 2 GM/50ML IV SOLN
2.0000 g | Freq: Once | INTRAVENOUS | Status: AC
  Administered 2015-12-14: 08:00:00 2 g via INTRAVENOUS
  Filled 2015-12-14: qty 50

## 2015-12-14 MED ORDER — PANTOPRAZOLE SODIUM 40 MG PO PACK
40.0000 mg | PACK | Freq: Every day | ORAL | Status: DC
  Administered 2015-12-14 – 2015-12-16 (×3): 40 mg
  Filled 2015-12-14 (×3): qty 20

## 2015-12-14 MED ORDER — CEFEPIME HCL 2 G IJ SOLR
2.0000 g | INTRAMUSCULAR | Status: DC
  Administered 2015-12-15 – 2015-12-16 (×2): 2 g via INTRAVENOUS
  Filled 2015-12-14 (×3): qty 2

## 2015-12-14 MED ORDER — CLONAZEPAM 0.5 MG PO TABS
0.5000 mg | ORAL_TABLET | Freq: Three times a day (TID) | ORAL | Status: DC
Start: 2015-12-14 — End: 2015-12-17
  Administered 2015-12-14 – 2015-12-16 (×8): 0.5 mg via ORAL
  Filled 2015-12-14 (×8): qty 1

## 2015-12-14 MED ORDER — CETYLPYRIDINIUM CHLORIDE 0.05 % MT LIQD
7.0000 mL | Freq: Two times a day (BID) | OROMUCOSAL | Status: DC
  Administered 2015-12-14 – 2015-12-16 (×4): 7 mL via OROMUCOSAL

## 2015-12-14 MED ORDER — RISAQUAD PO CAPS
1.0000 | ORAL_CAPSULE | Freq: Every day | ORAL | Status: DC
  Administered 2015-12-14 – 2015-12-16 (×3): 1 via ORAL
  Filled 2015-12-14 (×3): qty 1

## 2015-12-14 MED ORDER — JEVITY 1.2 CAL PO LIQD
1000.0000 mL | ORAL | Status: DC
  Administered 2015-12-14 (×2): 1000 mL
  Filled 2015-12-14: qty 1000

## 2015-12-14 MED ORDER — CHLORHEXIDINE GLUCONATE 0.12 % MT SOLN
15.0000 mL | Freq: Two times a day (BID) | OROMUCOSAL | Status: DC
  Administered 2015-12-14 – 2015-12-17 (×5): 15 mL via OROMUCOSAL
  Filled 2015-12-14 (×7): qty 15

## 2015-12-14 NOTE — Progress Notes (Signed)
Nutrition Follow-up   INTERVENTION:   EN: Spoke with RN, Judson Roch this am regarding nutrition poc as reported MD Mody wanting EN to be restarted. Will reorder Jevity 1.2 continuous to be started at 48mL/hr with titration orders to a goal rate of 109mL/hr. Will follow poc and tolerance.   NUTRITION DIAGNOSIS:   Inadequate oral intake related to acute illness as evidenced by NPO status, being addressed with TF via PEG  GOAL:   Patient will meet greater than or equal to 90% of their needs; ongoing  MONITOR:    (Energy Intake, Anthropometrics, Digestive System, Electrolyte/Renal Profile)  REASON FOR ASSESSMENT:   Consult Assessment of nutrition requirement/status  ASSESSMENT:    Pt TF held last night per chart review and Nsg report. Pt with lower blood sugars this am.  Palliative Care pending. Pt remains on isolation for MRSA.  Diet Order:  Diet NPO time specified   EN: pt remained NPO, without TF per Nsg   Gastrointestinal Profile: Last BM: 12/13/2015   Scheduled Medications:  . antiseptic oral rinse  7 mL Mouth Rinse q12n4p  . ceFEPime (MAXIPIME) IV  2 g Intravenous Q12H  . chlorhexidine  15 mL Mouth Rinse BID  . Chlorhexidine Gluconate Cloth  6 each Topical Q0600  . clopidogrel  75 mg Per Tube Daily  . enoxaparin (LOVENOX) injection  40 mg Subcutaneous QHS  . folic acid  [id number] mcg Per Tube Daily  . levETIRAcetam  500 mg Per Tube BID  . multivitamin with minerals  1 tablet Per Tube Daily  . mupirocin ointment  1 application Nasal BID  . pantoprazole sodium  40 mg Per Tube Daily  . QUEtiapine  25 mg Oral QHS  . sertraline  100 mg Per Tube QHS  . thiamine  100 mg Per Tube Daily  . vancomycin  750 mg Intravenous Q18H  . vitamin B-12  1,000 mcg Per Tube Daily    Continuous Medications:  . sodium chloride 75 mL/hr at 12/13/15 1839  . feeding supplement (JEVITY 1.2 CAL) 1,000 mL (12/14/15 1056)     Electrolyte/Renal Profile and Glucose Profile:   Recent Labs Lab  12/19/2015 2118 12/13/15 0312 12/14/15 0456  NA 138 138 138  K 3.9 3.7 3.5  CL 107 113* 114*  CO2 23 20* 19*  BUN 21* 18 14  CREATININE 0.92 0.85 0.91  CALCIUM 11.5* 10.5* 10.7*  MG  --   --  1.6*  PHOS  --   --  2.9  GLUCOSE 97 117* 78   Protein Profile:  Recent Labs Lab 12/21/2015 2118 12/13/15 0312  ALBUMIN 2.3* 2.0*     Weight Trend since Admission: Filed Weights   12/13/15 0308 12/14/15 0409  Weight: 103 lb 8.5 oz (46.96 kg) 101 lb 11.2 oz (46.131 kg)     Skin:  Reviewed, no issues   BMI:  Body mass index is 24.51 kg/(m^2).  Estimated Nutritional Needs:   Kcal:  [id number] kcals (BEE 881, 1.2 AF, 1.1-1.3 IF)   Protein:  47-56 g(1.0-1.2 g/kg)   Fluid:  1410-1645 mL (30-35 ml/kg)    HIGH Care Level  Dwyane Luo, RD, LDN Pager [phone] Weekend/On-Call Pager [phone]

## 2015-12-14 NOTE — Clinical Documentation Improvement (Signed)
Internal Medicine  Can the diagnosis of Hypotension be further specified? Please document findings in next progress note NOT in BPA drop down box. Thank you.   Shock, including Type:  Septic, Cardiogenic, Hyper/Hypoglycemic, Hypovolemic, Hemorrhagic, Neurogenic, Anaphylactic, Other type, including suspected or known cause and/or associated condition(s)  Shock ruled out   Volume depletion  Other  Clinically Undetermined  Document any associated diagnoses/conditions.  Supporting Information:  BP's 83/54 Map of 65, 80/58 Map of 66, HR > 90, RR 22 to 27  Fluid resuscitation given; IV Cefepime q12h and IV Vancomycin q18h initiated  "Due to low blood pressure patient may remain in step down and critically ill" 12/13/15 progress note  Please exercise your independent, professional judgment when responding. A specific answer is not anticipated or expected.  Thank You,  Zoila Shutter RN, BSN, [place] [phone]; Cell: [phone]

## 2015-12-14 NOTE — Clinical Documentation Improvement (Signed)
Internal Medicine  Please clarify if Sepsis ruled in or if Sepsis ruled out and document findings in next progress note NOT in BPA drop down box. Thank you!   Other  Clinically Undetermined  Supporting Information:  Sepsis noted on admit order and is on problem list  WBC = 14.7, HR > 90, RR - 22 to 27, hypotensive with BP's 83/54 Map 65, 80/58 Map of 66  Fluid resuscitation given; Cefepime IV q12h and IV Vancomycin q18h initiated  Please exercise your independent, professional judgment when responding. A specific answer is not anticipated or expected.  Thank You, Zoila Shutter RN, BSN, [place] [phone]; Cell: [phone]

## 2015-12-14 NOTE — Progress Notes (Signed)
[facility] [place] NAME: [name]    MR#:  [id number]  DATE OF BIRTH:  Oct 01, 1956  SUBJECTIVE:  Patient did not have PEG tube feedings started as a result concerned as she continues to have a cough.   REVIEW OF SYSTEMS:    Review of Systems  Constitutional: Positive for malaise/fatigue. Negative for fever and chills.  HENT: Negative for sore throat.   Eyes: Negative for blurred vision.  Respiratory: Positive for cough. Negative for hemoptysis, shortness of breath and wheezing.   Cardiovascular: Negative for chest pain, palpitations and leg swelling.  Gastrointestinal: Negative for nausea, vomiting, abdominal pain, diarrhea and blood in stool.  Genitourinary: Negative for dysuria.  Musculoskeletal: Negative for back pain.  Neurological: Positive for weakness. Negative for dizziness, tremors and headaches.  Endo/Heme/Allergies: Does not bruise/bleed easily.  Psychiatric/Behavioral: Positive for depression.    Tolerating Diet: Yes      DRUG ALLERGIES:   Allergies  Allergen Reactions  . Geodon [Ziprasidone Hcl] Other (See Comments)    Unknown reaction  . Haldol [Haloperidol] Other (See Comments)    Unknown reaction    VITALS:  Blood pressure 87/59, pulse 90, temperature 98.9 F (37.2 C), temperature source Oral, resp. rate 16, height 4\' 6"  (1.372 m), weight 46.131 kg (101 lb 11.2 oz), SpO2 99 %.  PHYSICAL EXAMINATION:   Physical Exam  Constitutional: She is oriented to person, place, and time. No distress.  Frail   HENT:  Head: Normocephalic.  Eyes: No scleral icterus.  Neck: Normal range of motion. Neck supple. No JVD present. No tracheal deviation present.  Cardiovascular: Normal rate, regular rhythm and normal heart sounds.  Exam reveals no gallop and no friction rub.   No murmur heard. Pulmonary/Chest: Effort normal and breath sounds normal. No respiratory distress. She has no wheezes. She has no rales.  She exhibits no tenderness.  Abdominal: Soft. Bowel sounds are normal. She exhibits no distension and no mass. There is no tenderness. There is no rebound and no guarding.  PEG in place  Musculoskeletal: Normal range of motion. She exhibits no edema.  Neurological: She is alert and oriented to person, place, and time.  Drowsy withdraws from my questions wants to be left alone  Skin: Skin is warm. No rash noted. No erythema.      LABORATORY PANEL:   CBC  Recent Labs Lab 12/14/15 0456  WBC 14.7*  HGB 8.2*  HCT 26.2*  PLT 319   ------------------------------------------------------------------------------------------------------------------  Chemistries   Recent Labs Lab 12/13/15 0312 12/14/15 0456  NA 138 138  K 3.7 3.5  CL 113* 114*  CO2 20* 19*  GLUCOSE 117* 78  BUN 18 14  CREATININE 0.85 0.91  CALCIUM 10.5* 10.7*  MG  --  1.6*  AST 18  --   ALT 9*  --   ALKPHOS 52  --   BILITOT 0.3  --    ------------------------------------------------------------------------------------------------------------------  Cardiac Enzymes  Recent Labs Lab 12/13/15 0108 12/13/15 0700 12/13/15 1315  TROPONINI 0.07* 0.08* 0.06*   ------------------------------------------------------------------------------------------------------------------  RADIOLOGY:  Dg Chest 2 View  12/30/2015  CLINICAL DATA:  Pt from [facility] Pt arrived via [facility]. Pt states that she fell. Reports pain in left hip from previous hip surgery but denies any new pain. Reports when she fell she hit her right elbow. Denies pain. Denies hitting her head. Pt is poor historian and difficult to obtain information from. EMS reports that pt did complain  on right hip pain . Per EMS, [facility] reports that patient has left hip pain. Pt is sitting with legs crossed in chair without discomfort. EXAM: CHEST  2 VIEW COMPARISON:  10/03/2015 FINDINGS: There is significant opacity at the left lung base, in part  due to a pleural effusion. There is associated parenchymal opacity. Pneumonia suspected. Right lung is clear other than stable apical scarring. No right pleural effusion. No pneumothorax. Changes from cardiac surgery and valve replacement are stable. No convincing mediastinal or hilar masses. Bony thorax is demineralized but grossly intact. IMPRESSION: 1. Moderate left pleural effusion with associated left lung base opacity suspicious for pneumonia. It could be due to atelectasis, however. No evidence of pulmonary edema. Electronically Signed   By: Lajean Manes M.D.   On: 01/06/2016 22:52   Ct Head Wo Contrast  12/20/2015  CLINICAL DATA:  Failure to thrive. History of seizures and CVA. Dementia. Altered mental status. EXAM: CT HEAD WITHOUT CONTRAST TECHNIQUE: Contiguous axial images were obtained from the base of the skull through the vertex without intravenous contrast. COMPARISON:  10/03/2015 FINDINGS: The ventricles are normal in overall configuration. There is mild ex vacuo dilation of the posterior right lateral ventricle ROM encephalomalacia due to an old right temporal, parietal, occipital lobe infarct. There is generalized ventricular enlargement and sulcal enlargement reflecting moderate atrophy, advanced for age. No hydrocephalus. There are no parenchymal masses or mass effect. There are old cerebellar infarcts, larger involving the left PICA distribution. There is no evidence of a recent cortical infarct. Patchy areas of white matter hypoattenuation noted consistent with mild to moderate chronic microvascular ischemic change. There are no extra-axial masses or abnormal fluid collections. There is no intracranial hemorrhage. Visualized sinuses are clear other than minor left maxillary sinus mucosal thickening. Clear mastoid air cells. IMPRESSION: 1. No acute intracranial abnormalities. 2. Old infarcts, moderate atrophy advanced for age and mild to moderate chronic microvascular ischemic change.  Electronically Signed   By: Lajean Manes M.D.   On: 12/20/2015 23:05   Dg Hip Unilat With Pelvis 2-3 Views Left  12/30/2015  CLINICAL DATA:  60 year old female with fall and left hip pain. Stop EXAM: DG HIP (WITH OR WITHOUT PELVIS) 2-3V LEFT COMPARISON:  None FINDINGS: There is advanced osteopenia. Left femoral intra medullary fixation hardware noted. There is no evidence of acute fracture or dislocation. MR first calcification noted within the pelvis, likely related to uterine fibroids. IMPRESSION: Advanced osteopenia.  No acute fracture or dislocation. Electronically Signed   By: Anner Crete M.D.   On: 12/19/2015 22:52   Dg Hip Unilat With Pelvis 2-3 Views Right  01/05/2016  CLINICAL DATA:  Pt from [facility]. Pt arrived via [facility]. Pt states that she fell. Reports pain in left hip from previous hip surgery but denies any new pain. Reports when she fell she hit her right elbow. Denies pain. Denies hitting her head. Pt is poor historian and difficult to obtain information from. EMS reports that pt did complain on right hip pain . Per EMS, [facility] reports that patient has left hip pain. Pt is sitting with legs crossed in chair without discomfort. EXAM: DG HIP (WITH OR WITHOUT PELVIS) 2-3V RIGHT COMPARISON:  None. FINDINGS: No fracture. Old fracture of the left proximal femur has been reduced with an intra medullary rod and screw. Hip joints, SI joints and pubic symphysis are normally aligned. Bones are demineralized. Vascular calcifications are noted along the iliac and femoral vessels. IMPRESSION: 1. No acute  fracture or acute finding. Electronically Signed   By: Lajean Manes M.D.   On: 01/04/2016 22:53     ASSESSMENT AND PLAN:   60 year old female with a history of stroke, chronic diastolic congestive heart failure and seizure disorder who presents from SNF after an unwitnessed fall.  1. Acute metabolic encephalopathy: This is due to left lung pneumonia. Patient's mental status is  at baseline. Continue treatment for pneumonia.  2.HCAP: Continue cefepime and vancomycin. Blood cultures so far negative.   3. Elevated troponin: This is due to demand ischemia and not ACS.  4. Accidental fall: Physical therapy consult   5. Hypotension, shock has been ruled out: Patient blood pressures are low but map is greater than 65.   low blood pressure is in the setting of poor by mouth intake and pneumonia. 6. Chronic diastolic heart failure: No acute exacerbation.   7. History of stroke: Continue Plavix   8. History of seizure: Continue Keppra   9. poor nutrition: It has been reported the patient throws the contents of her PEG feedings in the garbage at the skilled nursing facility. She has voiced to the staff there that she does not want to have any nutrition.   palliative care consulted for goals of care.   continue tube feeds as tolerated. She is at risk of aspiration however she does need nutrition. As per speech consultation she should only have oral intake for comfort.  10. Anemia of chronic disease: No indication for blood transfusion. Continue to monitor  11. Hypercalcemia: Continue IV fluids and repeat in a.m. Is still elevated then may want to order SPEP and PTH intact.  Management plans discussed with the patient and she is in agreement.  CODE STATUS: FULL TOTAL TIME TAKING CARE OF THIS PATIENT: 28 minutes.   discussed with palliative care yesterday.      POSSIBLE D/C 2-3 days, DEPENDING ON CLINICAL CONDITION.   Francena Zender M.D on 12/14/2015 at 10:37 AM  Between 7am to 6pm - Pager - [phone] After 6pm go to [email or web address] - password [email or web address] [facility]  Office  [phone]  CC: Primary care physician; Juluis Pitch, MD  Note: This dictation was prepared with Dragon dictation along with smaller phrase technology. Any transcriptional errors that result from this process are unintentional.

## 2015-12-14 NOTE — Progress Notes (Addendum)
Pharmacy Antibiotic Note  [name] is a 60 y.o. female admitted on 12/22/2015 with pneumonia.  Pharmacy has been consulted for cefepime and vancomycin dosing.  Plan: 1. Cefepime 2 gm IV Q24H 2. Vancomycin 750 mg IV Q18H. Pharmacy will continue to monitor and adjust as needed to maintain trough 15 to 20 mcg/mL. Will obtain trough prior to am dose on 3/5.    Height: 4\' 6"  (137.2 cm) Weight: 101 lb 11.2 oz (46.131 kg) IBW/kg (Calculated) : 31.7  Temp (24hrs), Avg:98.6 F (37 C), Min:97.5 F (36.4 C), Max:99.3 F (37.4 C)   Recent Labs Lab 01/10/2016 2118 12/13/15 0312 12/13/15 1001 12/14/15 0456  WBC 14.0* 10.5  --  14.7*  CREATININE 0.92 0.85  --  0.91  LATICACIDVEN  --  0.8 0.7  --     Estimated Creatinine Clearance: 40.4 mL/min (by C-G formula based on Cr of 0.91).    Allergies  Allergen Reactions  . Geodon [Ziprasidone Hcl] Other (See Comments)    Unknown reaction  . Haldol [Haloperidol] Other (See Comments)    Unknown reaction    Antiobiotics:  Levofloxacin 3/1>>3/1 Cefepime 3/2>> Vancomycin 3/2>>  Cultures:  MRSA PCR: Possitive  Blood Cultures x 2 (3/1): no growth to date  Blood Cultures x 2 (3/2): no growth to date   Pharmacy will continue to monitor and adjust per consult.    Lennan Malone L, Pharm.D.  12/14/2015 10:45 AM

## 2015-12-14 NOTE — Consult Note (Signed)
Palliative Care Update  Chart Reviewed and Pt Seen.  It appears she will be here a bit longer based on review of chart.  I will follow up Monday and attempt to discuss code status and goals of care with family.   Kirby Funk, MD

## 2015-12-14 NOTE — Progress Notes (Signed)
Patient agitated this morning and refusing most of assessment. She refuses to answer orientation questions and refuses to remain at a 30 degree angle in the bed. She has slouched down to the foot of the bed where she lays almost flat. Over night nursing was concerned about patient being able to tolerate tube feedings based on her admission diagnosis of CAP and hx of dysphagia. Pt's CBG has dropped to 72 this AM. Suggested adding dextrose to IV fluids, but per MD patient has tolerated tube feedings well since admission. Will restart tube feedings per MD order and reposition patient. Will emphasize to patient that she needs to remain at 30 degrees at all times to ensure safe administration of tube feedings and medications.

## 2015-12-14 NOTE — NC FL2 (Signed)
[place] LEVEL OF CARE SCREENING TOOL     IDENTIFICATION  Patient Name: [name] Birthdate: September 05, 1956 Sex: female Admission Date (Current Location): 01/01/2016  [place] and [place] Number:  Selena Lesser [id number] [place] and Address:  [facility], [address], [place],  [address]      Provider Number: [id number]  Attending Physician Name and Address:  Bettey Costa, MD  Relative Name and Phone Number:       Current Level of Care: Hospital Recommended Level of Care: [place] Prior Approval Number:    Date Approved/Denied:   PASRR Number: [id number] a.  Discharge Plan: SNF    Current Diagnoses: Patient Active Problem List   Diagnosis Date Noted  . Altered mental status 12/13/2015  . Hypotension 12/13/2015  . Sepsis ([place]) 12/13/2015  . Encephalopathy 10/03/2015  . Colitis 02/25/2015  . Cerebral infarction ([place]) 12/07/2014  . Dysphagia   . Acute respiratory failure with hypoxia ([place]) 11/29/2014  . Encounter for intubation 11/29/2014    Orientation RESPIRATION BLADDER Height & Weight     Self, Place  Normal Incontinent Weight: 101 lb 11.2 oz (46.131 kg) Height:  4\' 6"  (137.2 cm)  BEHAVIORAL SYMPTOMS/MOOD NEUROLOGICAL BOWEL NUTRITION STATUS      Incontinent Feeding tube  AMBULATORY STATUS COMMUNICATION OF NEEDS Skin   Extensive Assist Verbally Normal                       Personal Care Assistance Level of Assistance  Bathing, Feeding, Dressing Bathing Assistance: Maximum assistance Feeding assistance: Maximum assistance Dressing Assistance: Maximum assistance     Functional Limitations Info  Sight, Hearing, Speech Sight Info: Adequate Hearing Info: Adequate Speech Info: Impaired    SPECIAL CARE FACTORS FREQUENCY                       Contractures      Additional Factors Info  Allergies, Isolation Precautions   Allergies Info: Geodon, Haldol     Isolation Precautions  Info: MRSA     Current Medications (12/14/2015):  This is the current hospital active medication list Current Facility-Administered Medications  Medication Dose Route Frequency Provider Last Rate Last Dose  . 0.9 %  sodium chloride infusion   Intravenous Continuous Saundra Shelling, MD 75 mL/hr at 12/13/15 1839    . acetaminophen (TYLENOL) tablet 650 mg  650 mg Oral Q6H PRN Saundra Shelling, MD       Or  . acetaminophen (TYLENOL) suppository 650 mg  650 mg Rectal Q6H PRN Saundra Shelling, MD      . acidophilus (RISAQUAD) capsule 1 capsule  1 capsule Oral Daily Sital Mody, MD      . antiseptic oral rinse (CPC / CETYLPYRIDINIUM CHLORIDE 0.05%) solution 7 mL  7 mL Mouth Rinse q12n4p Sital Mody, MD   7 mL at 12/14/15 1056  . ceFEPIme (MAXIPIME) 2 g in dextrose 5 % 50 mL IVPB  2 g Intravenous Q12H Pavan Pyreddy, MD   2 g at 12/14/15 0500  . chlorhexidine (PERIDEX) 0.12 % solution 15 mL  15 mL Mouth Rinse BID Bettey Costa, MD   15 mL at 12/14/15 0912  . Chlorhexidine Gluconate Cloth 2 % PADS 6 each  6 each Topical Q0600 Saundra Shelling, MD   6 each at 12/13/15 0448  . clonazePAM (KLONOPIN) tablet 0.5 mg  0.5 mg Oral TID Bettey Costa, MD      . clopidogrel (PLAVIX) tablet 75 mg  75  mg Per Tube Daily Saundra Shelling, MD   75 mg at 12/14/15 0912  . enoxaparin (LOVENOX) injection 40 mg  40 mg Subcutaneous QHS Saundra Shelling, MD   40 mg at 12/13/15 0447  . feeding supplement (JEVITY 1.2 CAL) liquid 1,000 mL  1,000 mL Per Tube Continuous Sital Mody, MD 20 mL/hr at 12/14/15 1056 1,000 mL at 12/14/15 1056  . fentaNYL (DURAGESIC - dosed mcg/hr) patch 25 mcg  25 mcg Transdermal Q72H Sital Mody, MD      . folic acid (FOLVITE) tablet 0.5 mg  500 mcg Per Tube Daily Saundra Shelling, MD   0.5 mg at 12/14/15 0912  . levETIRAcetam (KEPPRA) 100 MG/ML solution 500 mg  500 mg Per Tube BID Saundra Shelling, MD   500 mg at 12/14/15 0911  . multivitamin with minerals tablet 1 tablet  1 tablet Per Tube Daily Saundra Shelling, MD   1 tablet at 12/14/15  0911  . mupirocin ointment (BACTROBAN) 2 % 1 application  1 application Nasal BID Saundra Shelling, MD   1 application at [id number] 0911  . ondansetron (ZOFRAN) tablet 4 mg  4 mg Oral Q6H PRN Saundra Shelling, MD       Or  . ondansetron (ZOFRAN) injection 4 mg  4 mg Intravenous Q6H PRN Pavan Pyreddy, MD      . pantoprazole sodium (PROTONIX) 40 mg/20 mL oral suspension 40 mg  40 mg Per Tube Daily Saundra Shelling, MD   40 mg at 12/14/15 1056  . QUEtiapine (SEROQUEL) tablet 25 mg  25 mg Oral QHS Pavan Pyreddy, MD   25 mg at 12/13/15 2200  . sertraline (ZOLOFT) tablet 100 mg  100 mg Per Tube QHS Saundra Shelling, MD   100 mg at 12/13/15 2200  . sodium chloride flush (NS) 0.9 % injection 3 mL  3 mL Intravenous PRN Dustin Flock, MD      . thiamine (VITAMIN B-1) tablet 100 mg  100 mg Per Tube Daily Saundra Shelling, MD   100 mg at 12/14/15 0912  . vancomycin (VANCOCIN) IVPB 750 mg/150 ml premix  750 mg Intravenous Q18H Saundra Shelling, MD   750 mg at 12/13/15 2150  . vitamin B-12 (CYANOCOBALAMIN) tablet 1,000 mcg  1,000 mcg Per Tube Daily Saundra Shelling, MD   1,000 mcg at 12/14/15 0911     Discharge Medications: Please see discharge summary for a list of discharge medications.  Relevant Imaging Results:  Relevant Lab Results:   Additional Information [id number]  Maurine Cane, LCSW

## 2015-12-14 NOTE — Clinical Social Work Note (Signed)
Clinical Social Work Assessment  Patient Details  Name: [name] MRN: [id number] Date of Birth: 04-05-1956  Date of referral:  12/14/15               Reason for consult:   (from [facility] SNF ( LTC))                Permission sought to share information with:  Facility Sport and exercise psychologist, Family Supports Permission granted to share information::  Yes, Verbal Permission Granted  Name::     [name] [phone]  Agency::     Relationship::     Contact Information:     Housing/Transportation Living arrangements for the past 2 months:  [place] of Information:  Patient, Medical Team, Facility, Guardian Patient Interpreter Needed:  None Criminal Activity/Legal Involvement Pertinent to Current Situation/Hospitalization:    Significant Relationships:  Other Family Members, [employer], Siblings Lives with:  Facility Resident Do you feel safe going back to the place where you live?  Yes Need for family participation in patient care:  Yes (Comment) (" my sister")  Care giving concerns:  Patient is chronically ill   Facilities manager / plan:  Holiday representative (CSW) consult, patient is from [facility] for long-term care.   Patient was alert, oriented and engaged in conversation with CSW (she displayed some confusion at times).  (Additional Information provided by facility and patient's sister [name]).  CSW introduced self and explained role of CSW department.  Patient states she has no children, her sister [name] is her primary contact (gave verbal permission to call) and informed CSW she lives in a house with her boyfriend.  When asked if she has been at [facility] patient replied  "oh.. Yes I have been there".  States the plan is for her to return when she leaves the hospital.    Patient currently lives at [facility] and plans to return at discharge.  She has been there about 1 month.  Per [facility] patient is able to return. Call to patient's  sister [name] [phone] who states that she is the legal guardian and primary contact as well as her daughter [name].  Confirms that plan is for patient to return to [facility].    CSW will complete FL2,  in anticipation of patient returning to [address] [facility] at discharge.  Employment status:  Disabled (Comment on whether or not currently receiving Disability) Insurance information:  Medicare, Medicaid In [place] PT Recommendations:  Not assessed at this time Information / Referral to community resources:   (no new referrals at this time)  Patient/Family's Response to care:  Patient and sister was appreciative of talking with CSW.   Patient/Family's Understanding of and Emotional Response to Diagnosis, Current Treatment, and Prognosis:  Patient and sister  understands that she is under continued medical work up at this time.    Emotional Assessment Appearance:  Appears stated age Attitude/Demeanor/Rapport:    Affect (typically observed):  Calm, Pleasant Orientation:  Oriented to Self, Oriented to Place Alcohol / Substance use:    Psych involvement (Current and /or in the community):  No (Comment)  Discharge Needs  Concerns to be addressed:  Care Coordination, Discharge Planning Concerns Readmission within the last 30 days:  No Current discharge risk:  Chronically ill Barriers to Discharge:  Continued Medical Work up, No Barriers Identified   Jean Cane, LCSW 12/14/2015, 1:31 PM

## 2015-12-15 LAB — CBC
HCT: 31.3 % — ABNORMAL LOW (ref 35.0–47.0)
Hemoglobin: 10 g/dL — ABNORMAL LOW (ref 12.0–16.0)
MCH: 23.5 pg — ABNORMAL LOW (ref 26.0–34.0)
MCHC: 31.8 g/dL — ABNORMAL LOW (ref 32.0–36.0)
MCV: 73.9 fL — ABNORMAL LOW (ref 80.0–100.0)
PLATELETS: 367 10*3/uL (ref 150–440)
RBC: 4.24 MIL/uL (ref 3.80–5.20)
RDW: 19.1 % — ABNORMAL HIGH (ref 11.5–14.5)
WBC: 10.8 10*3/uL (ref 3.6–11.0)

## 2015-12-15 LAB — GLUCOSE, CAPILLARY
GLUCOSE-CAPILLARY: 102 mg/dL — AB (ref 65–99)
GLUCOSE-CAPILLARY: 122 mg/dL — AB (ref 65–99)
GLUCOSE-CAPILLARY: 116 mg/dL — AB (ref 65–99)
GLUCOSE-CAPILLARY: 103 mg/dL — AB (ref 65–99)
Glucose-Capillary: 92 mg/dL (ref 65–99)

## 2015-12-15 LAB — BASIC METABOLIC PANEL
ANION GAP: 9 (ref 5–15)
BUN: 12 mg/dL (ref 6–20)
CHLORIDE: 112 mmol/L — AB (ref 101–111)
CO2: 15 mmol/L — ABNORMAL LOW (ref 22–32)
Calcium: 11 mg/dL — ABNORMAL HIGH (ref 8.9–10.3)
Creatinine, Ser: 0.85 mg/dL (ref 0.44–1.00)
Glucose, Bld: 117 mg/dL — ABNORMAL HIGH (ref 65–99)
POTASSIUM: 3.5 mmol/L (ref 3.5–5.1)
SODIUM: 136 mmol/L (ref 135–145)

## 2015-12-15 LAB — MAGNESIUM: Magnesium: 2.2 mg/dL (ref 1.7–2.4)

## 2015-12-15 NOTE — Progress Notes (Signed)
[place] at [place] NAME: [name]    MR#:  [id number]  DATE OF BIRTH:  1956-03-14  SUBJECTIVE:  Drowzy. Was more awake earlier. Refused TFs. But seems to be aspirating.   REVIEW OF SYSTEMS:    Review of Systems  Unable to perform ROS: mental acuity   Tolerating Diet: Yes  DRUG ALLERGIES:   Allergies  Allergen Reactions  . Geodon [Ziprasidone Hcl] Other (See Comments)    Unknown reaction  . Haldol [Haloperidol] Other (See Comments)    Unknown reaction    VITALS:  Blood pressure 111/75, pulse 103, temperature 97.9 F (36.6 C), temperature source Oral, resp. rate 19, height 4\' 6"  (1.372 m), weight 46.131 kg (101 lb 11.2 oz), SpO2 98 %.  PHYSICAL EXAMINATION:   Physical Exam  Constitutional: She is oriented to person, place, and time. No distress.  Frail   HENT:  Head: Normocephalic.  Eyes: No scleral icterus.  Neck: Normal range of motion. Neck supple. No JVD present. No tracheal deviation present.  Cardiovascular: Normal rate, regular rhythm and normal heart sounds.  Exam reveals no gallop and no friction rub.   No murmur heard. Pulmonary/Chest: Effort normal and breath sounds normal. No respiratory distress. She has no wheezes. She has no rales. She exhibits no tenderness.  Abdominal: Soft. Bowel sounds are normal. She exhibits no distension and no mass. There is no tenderness. There is no rebound and no guarding.  PEG in place  Musculoskeletal: Normal range of motion. She exhibits no edema.  Neurological: She is alert and oriented to person, place, and time.  Drowsy withdraws from my questions wants to be left alone  Skin: Skin is warm. No rash noted. No erythema.   LABORATORY PANEL:   CBC  Recent Labs Lab 12/15/15 0655  WBC 10.8  HGB 10.0*  HCT 31.3*  PLT 367   ------------------------------------------------------------------------------------------------------------------  Chemistries    Recent Labs Lab 12/13/15 0312  12/15/15 0655  NA 138  < > 136  K 3.7  < > 3.5  CL 113*  < > 112*  CO2 20*  < > 15*  GLUCOSE 117*  < > 117*  BUN 18  < > 12  CREATININE 0.85  < > 0.85  CALCIUM 10.5*  < > 11.0*  MG  --   < > 2.2  AST 18  --   --   ALT 9*  --   --   ALKPHOS 52  --   --   BILITOT 0.3  --   --   < > = values in this interval not displayed. ------------------------------------------------------------------------------------------------------------------  Cardiac Enzymes  Recent Labs Lab 12/13/15 0108 12/13/15 0700 12/13/15 1315  TROPONINI 0.07* 0.08* 0.06*   ------------------------------------------------------------------------------------------------------------------  RADIOLOGY:  No results found.   ASSESSMENT AND PLAN:   60 year old female with a history of stroke, chronic diastolic congestive heart failure and seizure disorder who presents from SNF after an unwitnessed fall.  # Acute metabolic encephalopathy: Due to left lung pneumonia. Patient's mental status is at baseline. Continue treatment for pneumonia.  # HCAP Continue cefepime and vancomycin. Blood cultures so far negative.   # Elevated troponin Due to demand ischemia and not ACS.  # Accidental fall: Physical therapy consult.  # Hypotension- Not shock Due to poor intake  6. Chronic diastolic heart failure: No acute exacerbation.  # H/o of CVAContinue Plavix   # History of seizure: Continue Keppra   # Severe malnutrition Patient refused  TFs at nursing home and also here. Now stopped due to some aspiration Likely contributed by severe depression. Consult physchiatry  Also palliative care consulted  # Anemia of chronic disease: No indication for blood transfusion. Continue to monitor  # Hypercalcemia: Check PTH and SPEP. Likely from dehydration  Management plans discussed with the patient and she is in agreement.  CODE STATUS: FULL  TOTAL TIME TAKING CARE OF THIS  PATIENT: 30 minutes.   POSSIBLE D/C 2-3 days, DEPENDING ON CLINICAL CONDITION.   Hillary Bow R M.D on 12/15/2015 at 1:37 PM  Between 7am to 6pm - Pager - [phone]  After 6pm go to [email or web address] - password [email or web address] [place] [facility]  Office  [phone]  CC: Primary care physician; Juluis Pitch, MD  Note: This dictation was prepared with Dragon dictation along with smaller phrase technology. Any transcriptional errors that result from this process are unintentional.

## 2015-12-15 NOTE — Progress Notes (Signed)
Nutrition Follow-up   INTERVENTION:   EN: Spoke with RN Tracie this am. Pt refusing to stay with HOB>30 degrees as pt resting at foot of bed flat as well as questionable aspiration per RN report. Pt disconnected TF late yesterday on her own. RD notes RN addressed with MD Sudini who was agreeable to holding TF at this time. Will follow poc and make recommendations accordingly.   NUTRITION DIAGNOSIS:   Inadequate oral intake related to acute illness as evidenced by NPO status.  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 Palliative Care team to see pt again Monday per note.  Diet Order:  Diet NPO time specified    Current Nutrition: pt receiving Jevity 1.2 at 33mL/hr   Gastrointestinal Profile: Last BM: 12/15/2015   Scheduled Medications:  . acidophilus  1 capsule Oral Daily  . antiseptic oral rinse  7 mL Mouth Rinse q12n4p  . ceFEPime (MAXIPIME) IV  2 g Intravenous Q24H  . chlorhexidine  15 mL Mouth Rinse BID  . Chlorhexidine Gluconate Cloth  6 each Topical Q0600  . clonazePAM  0.5 mg Oral TID  . clopidogrel  75 mg Per Tube Daily  . enoxaparin (LOVENOX) injection  40 mg Subcutaneous QHS  . fentaNYL  25 mcg Transdermal Q72H  . folic acid  [id number] mcg Per Tube Daily  . levETIRAcetam  500 mg Per Tube BID  . multivitamin with minerals  1 tablet Per Tube Daily  . mupirocin ointment  1 application Nasal BID  . pantoprazole sodium  40 mg Per Tube Daily  . QUEtiapine  25 mg Oral QHS  . sertraline  100 mg Per Tube QHS  . thiamine  100 mg Per Tube Daily  . vancomycin  750 mg Intravenous Q18H  . vitamin B-12  1,000 mcg Per Tube Daily    Continuous Medications:  . sodium chloride 75 mL/hr at 12/13/15 1839  . feeding supplement  (JEVITY 1.2 CAL) 1,000 mL (12/14/15 1457)     Electrolyte/Renal Profile and Glucose Profile:   Recent Labs Lab 12/13/15 0312 12/14/15 0456 12/15/15 0655  NA 138 138 136  K 3.7 3.5 3.5  CL 113* 114* 112*  CO2 20* 19* 15*  BUN 18 14 12   CREATININE 0.85 0.91 0.85  CALCIUM 10.5* 10.7* 11.0*  MG  --  1.6* 2.2  PHOS  --  2.9  --   GLUCOSE 117* 78 117*   Protein Profile:  Recent Labs Lab 01/04/2016 2118 12/13/15 0312  ALBUMIN 2.3* 2.0*     Weight Trend since Admission: Filed Weights   12/13/15 0308 12/14/15 0409 12/14/15 2159  Weight: 103 lb 8.5 oz (46.96 kg) 101 lb 11.2 oz (46.131 kg) 101 lb 11.2 oz (46.131 kg)     Skin:  Reviewed, no issues   BMI:  Body mass index is 24.51 kg/(m^2).  Estimated Nutritional Needs:   Kcal:  [id number] kcals (BEE 881, 1.2 AF, 1.1-1.3 IF)   Protein:  47-56 g(1.0-1.2 g/kg)   Fluid:  1410-1645 mL (30-35 ml/kg)    HIGH Care Level  Dwyane Luo, RD, LDN Pager [phone] Weekend/On-Call Pager [phone]

## 2015-12-15 NOTE — Progress Notes (Signed)
Dr.sudini notified of questionable aspiration with tube feeds as the pt will not stay up 30 degrees in the bed and continues to move to the bottom of the bed.  Ordered to hold tube feeds at this time.

## 2015-12-16 LAB — GLUCOSE, CAPILLARY
GLUCOSE-CAPILLARY: 76 mg/dL (ref 65–99)
GLUCOSE-CAPILLARY: 71 mg/dL (ref 65–99)
GLUCOSE-CAPILLARY: 72 mg/dL (ref 65–99)
Glucose-Capillary: 82 mg/dL (ref 65–99)
Glucose-Capillary: 75 mg/dL (ref 65–99)

## 2015-12-16 LAB — VANCOMYCIN, TROUGH: Vancomycin Tr: 32 ug/mL (ref 10–20)

## 2015-12-16 MED ORDER — JEVITY 1.2 CAL PO LIQD
1000.0000 mL | ORAL | Status: DC
  Administered 2015-12-16 – 2015-12-17 (×2): 1000 mL

## 2015-12-16 MED ORDER — JEVITY 1.2 CAL PO LIQD
1000.0000 mL | ORAL | Status: DC
  Filled 2015-12-16: qty 1000

## 2015-12-16 MED ORDER — SODIUM CHLORIDE 0.9 % IV SOLN
500.0000 mg | INTRAVENOUS | Status: DC
  Filled 2015-12-16: qty 500

## 2015-12-16 MED ORDER — JEVITY 1.2 CAL PO LIQD: 1000.0000 mL | ORAL | Status: DC

## 2015-12-16 NOTE — Progress Notes (Signed)
Pharmacy Antibiotic Note  [name] is a 60 y.o. female admitted on 12/14/2015 with pneumonia.  Pharmacy has been consulted for cefepime and vancomycin dosing.  Plan: This evening vancomycin trough resulted 32 mcg/mL, looks like it was timed and drawn appropriately, collected 2139, dose not charted yet. Decrease to vancomycin 500 mg IV Q24H, predicted trough 15 mcg/mL. Pharmacy will continue to follow and adjust as needed to maintain trough 15 to 20 mcg/mL.   Height: 4\' 6"  (137.2 cm) Weight: 105 lb 3.2 oz (47.718 kg) IBW/kg (Calculated) : 31.7  Temp (24hrs), Avg:97.7 F (36.5 C), Min:97.7 F (36.5 C), Max:97.7 F (36.5 C)   Recent Labs Lab 12/20/2015 2118 12/13/15 0312 12/13/15 1001 12/14/15 0456 12/15/15 0655 12/16/15 2139  WBC 14.0* 10.5  --  14.7* 10.8  --   CREATININE 0.92 0.85  --  0.91 0.85  --   LATICACIDVEN  --  0.8 0.7  --   --   --   VANCOTROUGH  --   --   --   --   --  32*    Estimated Creatinine Clearance: 43.9 mL/min (by C-G formula based on Cr of 0.85).    Allergies  Allergen Reactions  . Geodon [Ziprasidone Hcl] Other (See Comments)    Unknown reaction  . Haldol [Haloperidol] Other (See Comments)    Unknown reaction    Antiobiotics:  Levofloxacin 3/1>>3/1 Cefepime 3/2>> Vancomycin 3/2>>  Cultures:  MRSA PCR: Possitive  Blood Cultures x 2 (3/1): no growth to date  Blood Cultures x 2 (3/2): no growth to date   Pharmacy will continue to monitor and adjust per consult.    Laural Benes, Pharm.D., BCPS Clinical Pharmacist 12/16/2015 10:42 PM

## 2015-12-16 NOTE — Progress Notes (Signed)
Met with patient's sister who was available at bedside. She mentions that patient has been confused since her last stroke. Patient's niece  makes decisions and her phone number is [phone]

## 2015-12-16 NOTE — Plan of Care (Signed)
Problem: Fluid Volume: Goal: Hemodynamic stability will improve Outcome: Not Progressing Coughs up thick  Phlegm  Tan/ whitish in color at intevals. Suctioned as needed  To assist with phlegm removal. Tube feedings resumed at 15 ml / hr with  Water flush. Hob elevated  45 degrees.  ivfs  Cont.abx cont.  Problem: Respiratory: Goal: Ability to maintain adequate ventilation will improve Outcome: Progressing 02 in use and sats maintained  97-100 %

## 2015-12-16 NOTE — Progress Notes (Signed)
Pharmacy Antibiotic Note  [name] is a 60 y.o. female admitted on 12/27/2015 with pneumonia.  Pharmacy has been consulted for cefepime and vancomycin dosing.  Plan: This morning vancomycin dose charted against at 0346, level collected at 0423. Lab was contacted to credit level, and the trough has been rescheduled for 2130 before next dose tonight.     Height: 4\' 6"  (137.2 cm) Weight: 101 lb 11.2 oz (46.131 kg) IBW/kg (Calculated) : 31.7  Temp (24hrs), Avg:97.7 F (36.5 C), Min:97.5 F (36.4 C), Max:97.9 F (36.6 C)   Recent Labs Lab 12/29/2015 2118 12/13/15 0312 12/13/15 1001 12/14/15 0456 12/15/15 0655  WBC 14.0* 10.5  --  14.7* 10.8  CREATININE 0.92 0.85  --  0.91 0.85  LATICACIDVEN  --  0.8 0.7  --   --     Estimated Creatinine Clearance: 43.2 mL/min (by C-G formula based on Cr of 0.85).    Allergies  Allergen Reactions  . Geodon [Ziprasidone Hcl] Other (See Comments)    Unknown reaction  . Haldol [Haloperidol] Other (See Comments)    Unknown reaction    Antiobiotics:  Levofloxacin 3/1>>3/1 Cefepime 3/2>> Vancomycin 3/2>>  Cultures:  MRSA PCR: Possitive  Blood Cultures x 2 (3/1): no growth to date  Blood Cultures x 2 (3/2): no growth to date   Pharmacy will continue to monitor and adjust per consult.    Laural Benes, Pharm.D., BCPS Clinical Pharmacist 12/16/2015 5:53 AM

## 2015-12-16 NOTE — Progress Notes (Signed)
[place] at [place] NAME: [name]    MR#:  [id number]  DATE OF BIRTH:  [birth year]  SUBJECTIVE:  Drowzy. TFs stopped yesterday due to concern for aspiration.  REVIEW OF SYSTEMS:    Review of Systems  Unable to perform ROS: mental acuity   Tolerating Diet: Yes  DRUG ALLERGIES:   Allergies  Allergen Reactions  . Geodon [Ziprasidone Hcl] Other (See Comments)    Unknown reaction  . Haldol [Haloperidol] Other (See Comments)    Unknown reaction    VITALS:  Blood pressure 100/67, pulse 98, temperature 97.5 F (36.4 C), temperature source Oral, resp. rate 22, height 4\' 6"  (1.372 m), weight 47.718 kg (105 lb 3.2 oz), SpO2 100 %.  PHYSICAL EXAMINATION:   Physical Exam  Constitutional: She is oriented to person, place, and time. No distress.  Frail   HENT:  Head: Normocephalic.  Eyes: No scleral icterus.  Neck: Normal range of motion. Neck supple. No JVD present. No tracheal deviation present.  Cardiovascular: Normal rate, regular rhythm and normal heart sounds.  Exam reveals no gallop and no friction rub.   No murmur heard. Pulmonary/Chest: Effort normal and breath sounds normal. No respiratory distress. She has no wheezes. She has no rales. She exhibits no tenderness.  Abdominal: Soft. Bowel sounds are normal. She exhibits no distension and no mass. There is no tenderness. There is no rebound and no guarding.  PEG in place  Musculoskeletal: Normal range of motion. She exhibits no edema.  Neurological: She is alert and oriented to person, place, and time.  Drowsy   Skin: Skin is warm. No rash noted. No erythema.   LABORATORY PANEL:   CBC  Recent Labs Lab 12/15/15 0655  WBC 10.8  HGB 10.0*  HCT 31.3*  PLT 367   ------------------------------------------------------------------------------------------------------------------  Chemistries   Recent Labs Lab 12/13/15 0312  12/15/15 0655  NA 138  < > 136  K  3.7  < > 3.5  CL 113*  < > 112*  CO2 20*  < > 15*  GLUCOSE 117*  < > 117*  BUN 18  < > 12  CREATININE 0.85  < > 0.85  CALCIUM 10.5*  < > 11.0*  MG  --   < > 2.2  AST 18  --   --   ALT 9*  --   --   ALKPHOS 52  --   --   BILITOT 0.3  --   --   < > = values in this interval not displayed. ------------------------------------------------------------------------------------------------------------------  Cardiac Enzymes  Recent Labs Lab 12/13/15 0108 12/13/15 0700 12/13/15 1315  TROPONINI 0.07* 0.08* 0.06*   ------------------------------------------------------------------------------------------------------------------  RADIOLOGY:  No results found.   ASSESSMENT AND PLAN:   60 year old female with a history of stroke, chronic diastolic congestive heart failure and seizure disorder who presents from SNF after an unwitnessed fall.  # Severe malnutrition Patient refused TFs at nursing home and also here. Now stopped due to concern for aspiration. Will retsrat at 15 ml/hr today Likely contributed by severe depression. Consulted psychiatry and Dr. Gretel Acre thought she would be better served by Palliative care Palliative consulted   # Acute metabolic encephalopathy: Due to left lung pneumonia. Patient's mental status is at baseline. Continue treatment for pneumonia.  # HCAP Continue cefepime and vancomycin. Blood cultures are negative.   # Elevated troponin Due to demand ischemia and not ACS.  # Accidental fall: Physical therapy consult.  # Hypotension- Not  shock Due to poor intake  # Chronic diastolic heart failure: No acute exacerbation.  # H/o of CVAContinue Plavix   # History of seizure: Continue Keppra   # Anemia of chronic disease: No indication for blood transfusion. Continue to monitor  # Hypercalcemia: Check PTH and SPEP. Likely from dehydration  Management plans discussed with the patient and she is in agreement.  CODE STATUS: FULL  TOTAL TIME  TAKING CARE OF THIS PATIENT: 30 minutes.   POSSIBLE D/C 2-3 days, DEPENDING ON CLINICAL CONDITION.   Hillary Bow R M.D on 12/16/2015 at 12:59 PM  Between 7am to 6pm - Pager - [phone]  After 6pm go to [email or web address] - password [email or web address] [place] [facility]  Office  [phone]  CC: Primary care physician; Juluis Pitch, MD  Note: This dictation was prepared with Dragon dictation along with smaller phrase technology. Any transcriptional errors that result from this process are unintentional.

## 2015-12-16 NOTE — Progress Notes (Signed)
Dr sudini  Notified of pts  bs of 71.  Response ok  She has been running  That  Recheck in an hour

## 2015-12-16 NOTE — Progress Notes (Addendum)
1921 No IV at start of shift.  Attempted x1 to left AC with no success.  No other sites visible to me.  Notified charge nurse who called Nurse supervisor to try [place] Nurse supervisor to come and try IV.  Keppra and Vanco have not been given because of lack of IV access. Dorna Bloom RN [phone] Spoke with Dr Verdell Carmine.  Unable to obtain IV access.  Order for PICC.  Spoke with [employer] and they will not be out until 9 AM Monday. Legal guardian will need to be notified for consent.  Diamantina Providence (sister)  [phone] or [phone]. Dorna Bloom RN

## 2015-12-17 DIAGNOSIS — I469 Cardiac arrest, cause unspecified: Secondary | ICD-10-CM | POA: Insufficient documentation

## 2015-12-17 LAB — COMPREHENSIVE METABOLIC PANEL
ALT: 10 U/L — ABNORMAL LOW (ref 14–54)
AST: 21 U/L (ref 15–41)
Albumin: 2 g/dL — ABNORMAL LOW (ref 3.5–5.0)
Alkaline Phosphatase: 55 U/L (ref 38–126)
Anion gap: 10 (ref 5–15)
BUN: 16 mg/dL (ref 6–20)
CO2: 21 mmol/L — ABNORMAL LOW (ref 22–32)
Calcium: 12 mg/dL — ABNORMAL HIGH (ref 8.9–10.3)
Chloride: 115 mmol/L — ABNORMAL HIGH (ref 101–111)
Creatinine, Ser: 0.87 mg/dL (ref 0.44–1.00)
GFR calc Af Amer: >60 mL/min (ref >60)
GFR calc non Af Amer: >60 mL/min (ref >60)
Glucose, Bld: 99 mg/dL (ref 65–99)
Potassium: 3.4 mmol/L — ABNORMAL LOW (ref 3.5–5.1)
Sodium: 146 mmol/L — ABNORMAL HIGH (ref 135–145)
Total Bilirubin: 0.5 mg/dL (ref 0.3–1.2)
Total Protein: 6.4 g/dL — ABNORMAL LOW (ref 6.5–8.1)

## 2015-12-17 LAB — CBC WITH DIFFERENTIAL/PLATELET
Basophils Absolute: 0 10*3/uL (ref 0–0.1)
Basophils Relative: 0 %
Eosinophils Absolute: 0.1 10*3/uL (ref 0–0.7)
Eosinophils Relative: 1 %
HCT: 26.9 % — ABNORMAL LOW (ref 35.0–47.0)
Hemoglobin: 8.4 g/dL — ABNORMAL LOW (ref 12.0–16.0)
Lymphocytes Relative: 6 %
Lymphs Abs: 0.6 10*3/uL — ABNORMAL LOW (ref 1.0–3.6)
MCH: 23.2 pg — ABNORMAL LOW (ref 26.0–34.0)
MCHC: 31.3 g/dL — ABNORMAL LOW (ref 32.0–36.0)
MCV: 74 fL — ABNORMAL LOW (ref 80.0–100.0)
Monocytes Absolute: 1.1 10*3/uL — ABNORMAL HIGH (ref 0.2–0.9)
Monocytes Relative: 11 %
Neutro Abs: 8.4 10*3/uL — ABNORMAL HIGH (ref 1.4–6.5)
Neutrophils Relative %: 82 %
Platelets: 328 10*3/uL (ref 150–440)
RBC: 3.63 MIL/uL — ABNORMAL LOW (ref 3.80–5.20)
RDW: 18.8 % — ABNORMAL HIGH (ref 11.5–14.5)
WBC: 10.3 10*3/uL (ref 3.6–11.0)

## 2015-12-17 LAB — PARATHYROID HORMONE, INTACT (NO CA): PTH: 6 pg/mL — ABNORMAL LOW (ref 15–65)

## 2015-12-17 LAB — CULTURE, BLOOD (ROUTINE X 2)
CULTURE: NO GROWTH
Culture: NO GROWTH

## 2015-12-17 LAB — GLUCOSE, CAPILLARY
Glucose-Capillary: 82 mg/dL (ref 65–99)
Glucose-Capillary: 98 mg/dL (ref 65–99)
Glucose-Capillary: 103 mg/dL — ABNORMAL HIGH (ref 65–99)

## 2015-12-18 LAB — PARATHYROID HORMONE, INTACT (NO CA): PTH: 9 pg/mL — ABNORMAL LOW (ref 15–65)

## 2015-12-19 LAB — PROTEIN ELECTROPHORESIS, SERUM
A/G RATIO SPE: 0.6 — AB (ref 0.7–1.7)
ALBUMIN ELP: 1.9 g/dL — AB (ref 2.9–4.4)
ALPHA-1-GLOBULIN: 0.5 g/dL — AB (ref 0.0–0.4)
ALPHA-2-GLOBULIN: 0.6 g/dL (ref 0.4–1.0)
Beta Globulin: 1.5 g/dL — ABNORMAL HIGH (ref 0.7–1.3)
GLOBULIN, TOTAL: 3.4 g/dL (ref 2.2–3.9)
Gamma Globulin: 0.8 g/dL (ref 0.4–1.8)
Total Protein ELP: 5.3 g/dL — ABNORMAL LOW (ref 6.0–8.5)

## 2016-01-12 NOTE — Consult Note (Signed)
PATIENT NAME: [name] MEDICAL RECORD NUMBER: [id number] Birthday: 10-29-1955  Age: 60 y.o. Admit Date: 01/08/2016  Provider: Mortimer Fries  Indication: cardiac arrest  Technical Description:   CPR performance duration: 25 mins  Was defibrillation or cardioversion used ?no  Was external pacer placed ? no  Was patient intubated pre/post CPR ?yes  Was transvenous pacer placed ? no  Medications Administered Include      Yes/no Amiodarone   Atropine y  Calcium y  Epinephrine y  Lidocaine   Magnesium y  Norepinephrine   Phenylephrine   Sodium bicarbonate y  Vasopression y   Evaluation  Final Status - Was patient successfully resuscitated ? No   Flora Lipps 2017-03-279:41 AM     Corrin [name], M.D.  [facility]  Medical Director [place] Director [facility] Cardio-Pulmonary Department

## 2016-01-12 NOTE — Progress Notes (Signed)
   14-Jan-2016 0940  Clinical Encounter Type  Visited With Patient and family together  Visit Type Initial  Referral From Family  Consult/Referral To Chaplain  Spiritual Encounters  Spiritual Needs Prayer;Emotional;Grief support  Stress Factors  Patient Stress Factors Health changes  Family Stress Factors Major life changes  Met w/family with patient at their request. Patient was not conscious throughout the visit. Provided pastoral care and prayer to family. Chap. Lisel Siegrist G. [place]

## 2016-01-12 NOTE — Discharge Summary (Signed)
[place] at [place] NAME: [name]    MR#:  [id number]  DATE OF BIRTH:  1956/09/06  DATE OF ADMISSION:  01/03/2016 ADMITTING PHYSICIAN: Saundra Shelling, MD  DATE OF DISCHARGE: 01/11/2016  9:05 AM  PRIMARY CARE PHYSICIAN: Juluis Pitch, MD    ADMISSION DIAGNOSIS:  HCAP (healthcare-associated pneumonia) [J18.9] Fall, initial encounter [W19.XXXA] Altered mental status, unspecified altered mental status type [R41.82] Anemia, unspecified anemia type [D64.9]   DISCHARGE DIAGNOSIS:  Cardiopulmonary arrest.  Acute respiratory failure.  Acute metabolic encephalopathy due to left lung pneumonia. Severe malnutrition SECONDARY DIAGNOSIS:   Past Medical History  Diagnosis Date  . ETOH abuse   . CVA (cerebral infarction)   . Acute CHF (congestive heart failure) ([place])   . Stress-induced cardiomyopathy   . Clostridium difficile colitis   . Seizures ([place])   . Hyperlipidemia   . Cancer ([place])   . Cervical cancer ([place])   . Throat cancer [facility])     HOSPITAL COURSE:  60 year old female with a history of stroke, chronic diastolic congestive heart failure and seizure disorder who presents from SNF after an unwitnessed fall.  # Severe malnutrition Patient refused TFs at nursing home and also here. PEG was stopped due to concern for aspiration. Likely contributed by severe depression. Consulted psychiatry and Dr. Gretel Acre thought she would be better served by Palliative care Palliative consulted.  # Acute metabolic encephalopathy: Due to left lung pneumonia. She was treated for pneumonia.  # HCAP Treated with cefepime and vancomycin. Blood cultures are negative.   # Elevated troponin Due to demand ischemia and not ACS.  # Accidental fall: on Physical therapy consult.  # Hypotension- Not shock Due to poor intake  # Chronic diastolic heart failure: No acute exacerbation.  # H/o of CVA. On Plavix   # History of seizure: on Keppra    # Anemia of chronic disease: stable.  # Hypercalcemia: Likely from dehydration   Cardiopulmonary arrest.  Acute respiratory failure. She was found pulseless, code blue was called. CPR was performed for 25 min, given epi, bicarb and vasopressin. Intubated. Central line placed by Dr. Mortimer Fries. Patient expired at 9:05.  I discussed with family members.  DISCHARGE CONDITIONS:   The patient expired 905 today.   Demetrios Loll M.D on 01-11-2016 at 4:30 PM  Between 7am to 6pm - Pager - [phone]  After 6pm go to [email or web address] - password [email or web address] [place] [facility]  Office  [phone]  CC: Primary care physician; Juluis Pitch, MD

## 2016-01-12 NOTE — Clinical Social Work Note (Signed)
Pt expired this morning. CSW spoke with family and provided support. CSW also updated [facility]. CSW is signing off as no further needs identified.  [name], MSW, LCSW  Clinical Social Worker  [phone]

## 2016-01-12 NOTE — ED Provider Notes (Signed)
[place]  Department of Emergency Medicine   Code Blue CONSULT NOTE  Chief Complaint: Cardiac arrest/unresponsive   Level V Caveat: Unresponsive  History of present illness: I was contacted by the hospital for a CODE BLUE cardiac arrest upstairs and presented to the patient's bedside.   60 y.o. female with a history of alcoholic psychosis, CVA, throat cancer status post G-tube who was admitted for altered mental status, and became unresponsive this morning. On my arrival, the patient was pulseless and CPR was in progress. She was being bagged. Without any sedation, she was completely unresponsive.  ROS: Unable to obtain, Level V caveat  Scheduled Meds: . acidophilus  1 capsule Oral Daily  . antiseptic oral rinse  7 mL Mouth Rinse q12n4p  . ceFEPime (MAXIPIME) IV  2 g Intravenous Q24H  . chlorhexidine  15 mL Mouth Rinse BID  . Chlorhexidine Gluconate Cloth  6 each Topical Q0600  . clonazePAM  0.5 mg Oral TID  . clopidogrel  75 mg Per Tube Daily  . enoxaparin (LOVENOX) injection  40 mg Subcutaneous QHS  . feeding supplement (JEVITY 1.2 CAL)  1,000 mL Per Tube Q24H  . fentaNYL  25 mcg Transdermal Q72H  . folic acid  [id number] mcg Per Tube Daily  . levETIRAcetam  500 mg Per Tube BID  . multivitamin with minerals  1 tablet Per Tube Daily  . mupirocin ointment  1 application Nasal BID  . pantoprazole sodium  40 mg Per Tube Daily  . QUEtiapine  25 mg Oral QHS  . sertraline  100 mg Per Tube QHS  . thiamine  100 mg Per Tube Daily  . vancomycin  500 mg Intravenous Q24H  . vitamin B-12  1,000 mcg Per Tube Daily   Continuous Infusions: . sodium chloride Stopped (12/16/15 1900)   PRN Meds:.acetaminophen **OR** acetaminophen, ondansetron **OR** ondansetron (ZOFRAN) IV, sodium chloride flush Past Medical History  Diagnosis Date  . ETOH abuse   . CVA (cerebral infarction)   . Acute CHF (congestive heart failure) ([place])   . Stress-induced cardiomyopathy   . Clostridium  difficile colitis   . Seizures ([place])   . Hyperlipidemia   . Cancer ([place])   . Cervical cancer ([place])   . Throat cancer [facility])    Past Surgical History  Procedure Laterality Date  . Coronary artery bypass graft    . Colonoscopy with propofol N/A 04/23/2015    Procedure: COLONOSCOPY WITH PROPOFOL;  Surgeon: Manya Silvas, MD;  Location: [facility] END[facility];  Service: Endoscopy;  Laterality: N/A;  . 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 Marital Status: Married    Spouse Name: N/A  . Number of Children: N/A  . Years of Education: N/A   Occupational History  . retired    Social History Main Topics  . Smoking status: Current Every Day Smoker -- 1.00 packs/day    Types: Cigarettes  . Smokeless tobacco: Not on file  . Alcohol Use: 0.0 oz/week    0 Standard drinks or equivalent per week     Comment: twice a month  . Drug Use: No  . Sexual Activity: No   Other Topics Concern  . Not on file   Social History Narrative   Resident of [facility] [place] [facility].   Allergies  Allergen Reactions  . Geodon [Ziprasidone Hcl] Other (  See Comments)    Unknown reaction  . Haldol [Haloperidol] Other (See Comments)    Unknown reaction    Last set of Vital Signs (not current) Filed Vitals:   12/16/15 2027 Jan 05, 2016 0541  BP: 127/77 137/86  Pulse: 108 110  Temp: 97.7 F (36.5 C) 97.4 F (36.3 C)  Resp: 18 22      Physical Exam  Gen: unresponsive, cachectic. Cardiovascular: pulseless  Resp: apneic. Breath sounds equal bilaterally with bagging  Abd: nondistended  Neuro: GCS 3, unresponsive to pain  HEENT: No blood in posterior pharynx, gag reflex absent. Copious secretions in the posterior pharynx that were easily suctioned.  Neck: No crepitus  Musculoskeletal: No deformity  Skin: warm  Procedures  INTUBATION Performed by:  Eula Listen Required items: required blood products, implants, devices, and special equipment available Patient identity confirmed: provided demographic data and hospital-assigned identification number Time out: Immediately prior to procedure a "time out" was called to verify the correct patient, procedure, equipment, support staff and site/side marked as required. Indications: Pulseless cardiac arrest with apnea  Intubation method: Direct laryngoscopy Preoxygenation: BVM Sedatives: None  Paralytic: None  Tube Size: 8.0 cuffed Post-procedure assessment: chest rise and ETCO2 monitor; has a color change. Breath sounds: equal and absent over the epigastrium Tube secured by Respiratory Therapy Patient tolerated the procedure well with no immediate complications.  CRITICAL CARE Performed by: Eula Listen Total critical care time: 30 Critical care time was exclusive of separately billable procedures and treating other patients. Critical care was necessary to treat or prevent imminent or life-threatening deterioration. Critical care was time spent personally by me on the following activities: development of treatment plan with patient and/or surrogate as well as nursing, discussions with consultants, evaluation of patient's response to treatment, examination of patient, obtaining history from patient or surrogate, ordering and performing treatments and interventions, ordering and review of laboratory studies, ordering and review of radiographic studies, pulse oximetry and re-evaluation of patient's condition.  Cardiopulmonary Resuscitation (CPR) Procedure Note CPR was in progress on my arrival and continued throughout per ACLS protocol.  Directed/Performed by: Eula Listen I personally directed ancillary staff and/or performed CPR in an effort to regain return of spontaneous circulation and to maintain cardiac, neuro and systemic perfusion.   IO established in the  right humeral head per nurse, Marya Amsler.   Medical Decision making  Chronically ill female with pulseless cardiac arrest with apnea. The patient was given CPR, medications per ACLS protocol, and intubated. The intensivist arrived during the procedure and a direct handoff was established. Upon my departure, the patient was still actively receiving CPR and being bagged through her ET tube.   Assessment and Plan  The above    Eula Listen, MD 01-05-16 1511

## 2016-01-12 NOTE — Procedures (Signed)
Central Venous Catheter Placement: Indication: Patient receiving vesicant or irritant drug.; Patient receiving intravenous therapy for longer than 5 days.; Patient has limited or no vascular access.   Consent:emergent  Hand washing performed prior to starting the procedure.   Procedure: An active timeout was performed and correct patient, name, & ID confirmed.  After explaining risk and benefits, patient was positioned correctly for central venous access. Patient was prepped using strict sterile technique including chlorohexadine preps, sterile drape, sterile gown and sterile gloves.  The area was prepped, draped and anesthetized in the usual sterile manner. Patient comfort was obtained.  A triple lumen catheter was placed in RT femoral  Vein There was good blood return, catheter caps were placed on lumens, catheter flushed easily, the line was secured and a sterile dressing and BIO-PATCH applied.   Ultrasound was used to visualize vasculature and guidance of needle.   Number of Attempts: 1 Complications:none Estimated Blood Loss: none Chest Radiograph indicated and ordered.  Operator: Jedaiah Rathbun.   Corrin [name], M.D.  [facility]  Medical Director [place] Director [facility] Cardio-Pulmonary Department

## 2016-01-12 NOTE — Progress Notes (Signed)
[facility] NAME: [name]    MR#:  [id number]  DATE OF BIRTH:  1956/02/28  SUBJECTIVE:   TFs stopped due to concern for aspiration. Pt was found bradycardia at 28 and then pulseless. Code blue was called and CPR started.  REVIEW OF SYSTEMS:    Review of Systems  Unable to perform ROS: mental acuity   Tolerating Diet: Yes  DRUG ALLERGIES:   Allergies  Allergen Reactions  . Geodon [Ziprasidone Hcl] Other (See Comments)    Unknown reaction  . Haldol [Haloperidol] Other (See Comments)    Unknown reaction    VITALS:  Blood pressure 137/86, pulse 110, temperature 97.4 F (36.3 C), temperature source Oral, resp. rate 22, height 4\' 6"  (1.372 m), weight 49.76 kg (109 lb 11.2 oz), SpO2 96 %.  PHYSICAL EXAMINATION:   Physical Exam  Constitutional: She is unresponsive. Frail   HENT:  Head: Normocephalic.  Eyes: No scleral icterus. pupils are dilated without light reaction. Neck: Normal range of motion. Neck supple. No JVD present. Intubated on bag. Cardiovascular: no heart rate or pulse, on CPR now. No murmur heard. Pulmonary/Chest: bilateral crackles. Abdominal: Soft. Bowel sounds are hypoactive. PEG in place  Musculoskeletal: Normal range of motion. She exhibits no edema.  Neurological: She is unresponsive. Skin: No rash noted. No erythema.   LABORATORY PANEL:   CBC  Recent Labs Lab 01-13-16 0437  WBC 10.3  HGB 8.4*  HCT 26.9*  PLT 328   ------------------------------------------------------------------------------------------------------------------  Chemistries   Recent Labs Lab 12/15/15 0655 2016-01-13 0437  NA 136 146*  K 3.5 3.4*  CL 112* 115*  CO2 15* 21*  GLUCOSE 117* 99  BUN 12 16  CREATININE 0.85 0.87  CALCIUM 11.0* 12.0*  MG 2.2  --   AST  --  21  ALT  --  10*  ALKPHOS  --  55  BILITOT  --  0.5    ------------------------------------------------------------------------------------------------------------------  Cardiac Enzymes  Recent Labs Lab 12/13/15 0108 12/13/15 0700 12/13/15 1315  TROPONINI 0.07* 0.08* 0.06*   ------------------------------------------------------------------------------------------------------------------  RADIOLOGY:  No results found.   ASSESSMENT AND PLAN:   60 year old female with a history of stroke, chronic diastolic congestive heart failure and seizure disorder who presents from SNF after an unwitnessed fall.  Cardiopulmonary arrest.  On CPR, given epi, bicarb and vasopressin. Intubated. Central line placed by Dr. Mortimer Fries. Patient expired at 9:05.  Per RN, pt's family members are coming.  EXPIRED AT 9:05.   CODE STATUS: FULL  TOTAL CRITICAL TIME TAKING CARE OF THIS PATIENT: 30 minutes.      Demetrios Loll M.D on 13-Jan-2016 at 9:04 AM  Between 7am to 6pm - Pager - [phone]  After 6pm go to [email or web address] - password [email or web address] [facility]  Office  [phone]  CC: Primary care physician; Juluis Pitch, MD  Note: This dictation was prepared with Dragon dictation along with smaller phrase technology. Any transcriptional errors that result from this process are unintentional.

## 2016-01-12 DEATH — deceased

## 2016-10-19 IMAGING — MR MRI HEAD WITHOUT CONTRAST
8 of 10 series · 33 of 48 positions shown · non-contrast
Comparison: CT head 10/19/2014

CLINICAL DATA: Altered mental status. Confusion. History of stroke.
Cardiac valve replacement.

EXAM:
MRI HEAD WITHOUT CONTRAST
TECHNIQUE: Multiplanar, multiecho pulse sequences of the brain and surrounding
structures were obtained without intravenous contrast.

[Series 2: T1 · sagittal · 5.0mm · 0.36mm/px · 4 of 28 slices shown]
[im 1/28]
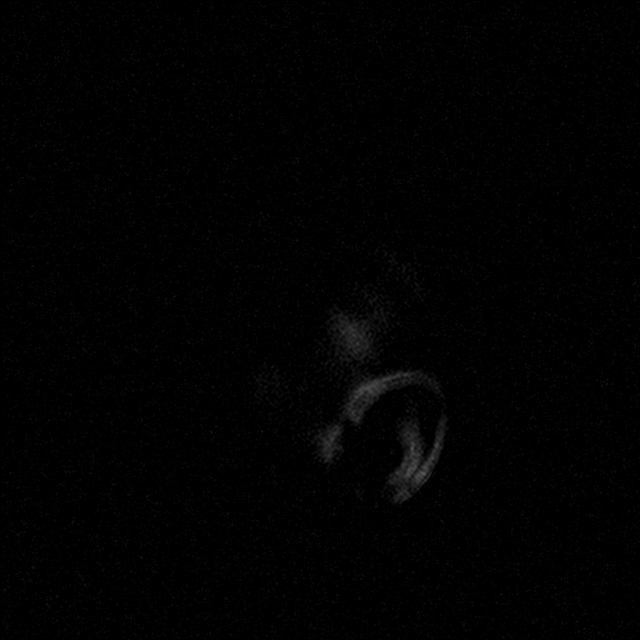
[im 7/28]
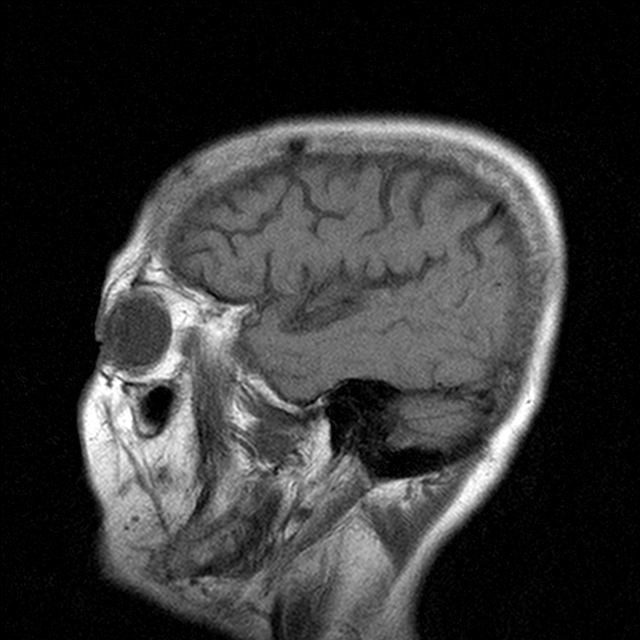
[im 14/28]
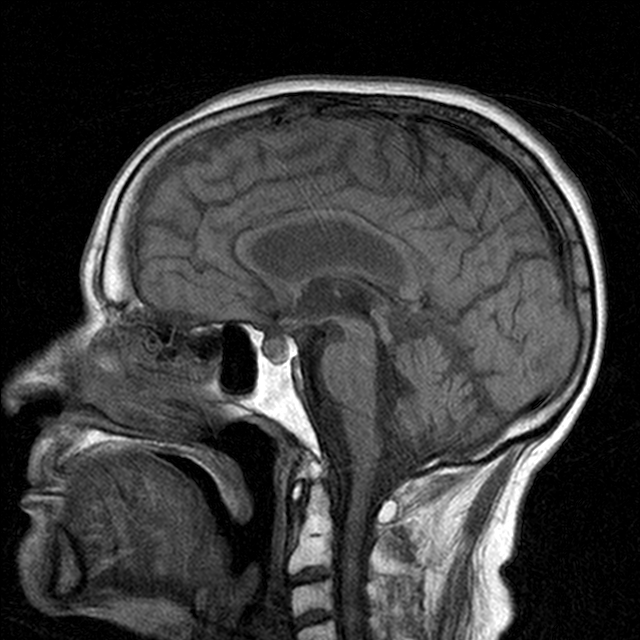
[im 21/28]
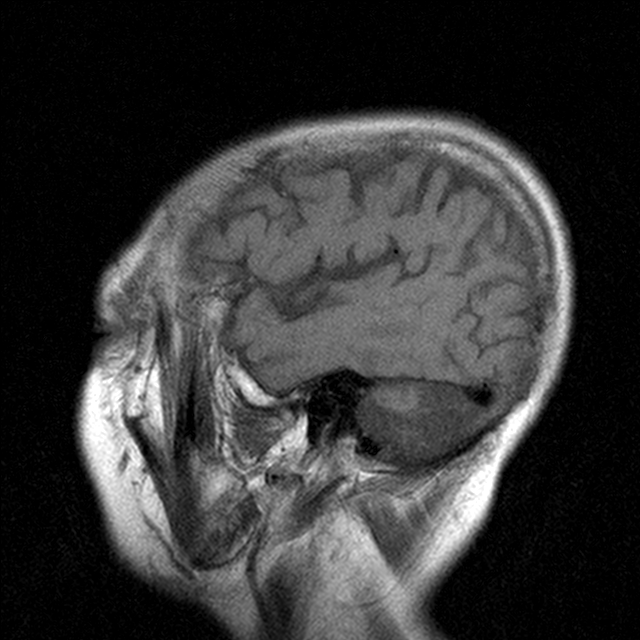

[Series 4: DWI · axial · 5.0mm · 0.94mm/px · z∈[-75,+74]mm · 4 of 26 slices shown (1 of 3)]
[im 1/26]
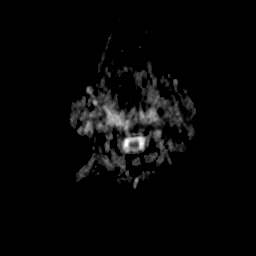
[im 9/26]
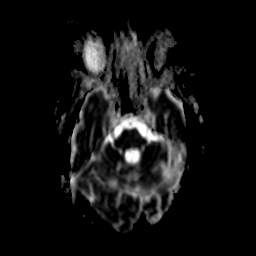
[im 17/26]
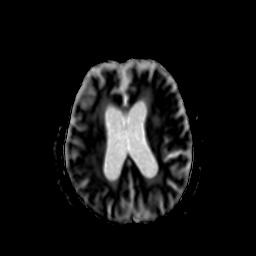
[im 26/26]
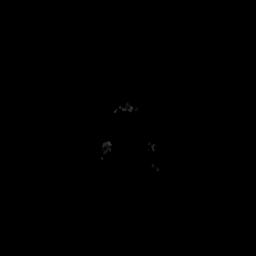

[Series 5: DWI · axial · 5.0mm · 0.94mm/px · z∈[-75,+74]mm · 4 of 27 slices shown (2 of 3)]
[im 1/27]
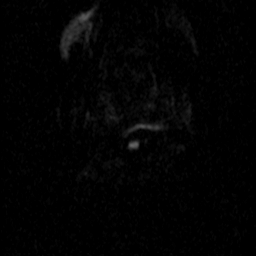
[im 9/27]
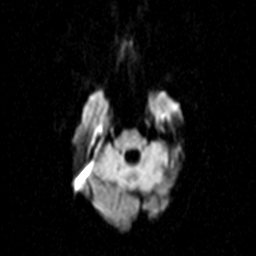
[im 18/27]
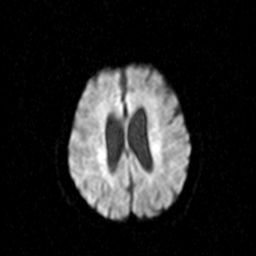
[im 27/27]
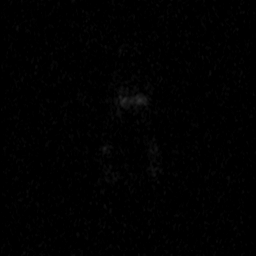

[Series 7: DWI · coronal · 5.0mm · 0.94mm/px · 5 of 35 slices shown (3 of 3)]
[im 1/35]
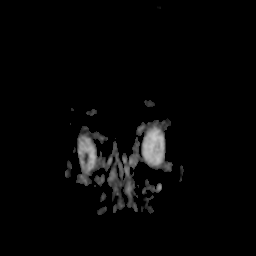
[im 9/35]
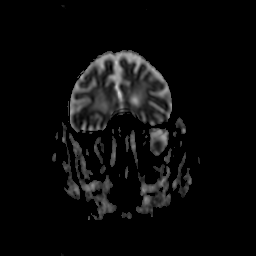
[im 18/35]
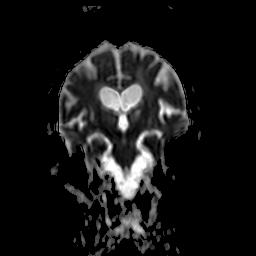
[im 26/35]
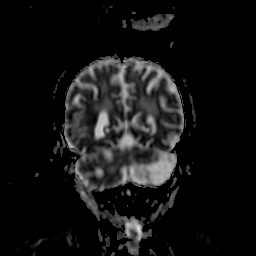
[im 35/35]
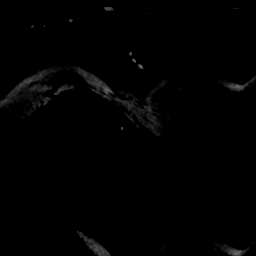

[Series 9: T2 · axial · 5.0mm · 0.68mm/px · z∈[-75,+74]mm · 4 of 24 slices shown (1 of 3)]
[im 1/24]
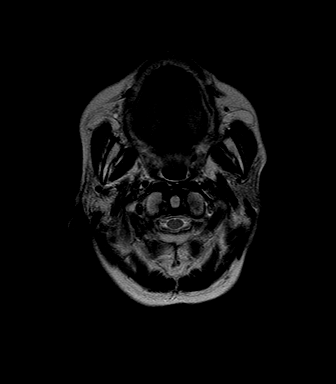
[im 8/24]
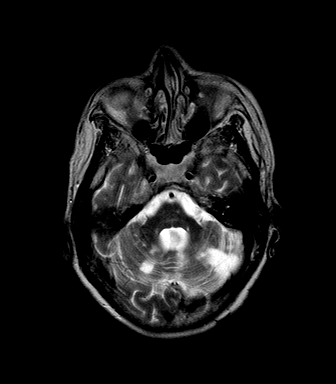
[im 16/24]
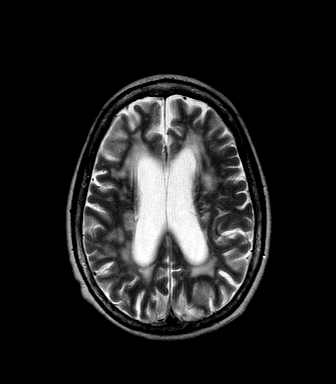
[im 24/24]
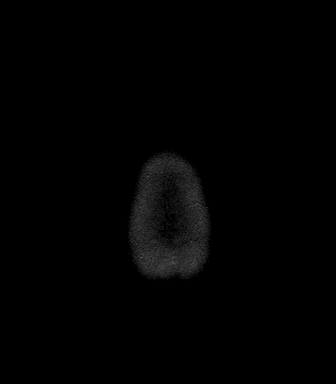

[Series 11: T2 · axial · 5.0mm · 0.45mm/px · z∈[-69,+74]mm · 4 of 23 slices shown (2 of 3)]
[im 1/23]
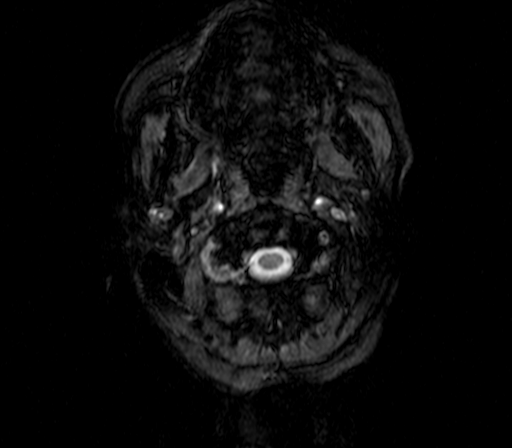
[im 8/23]
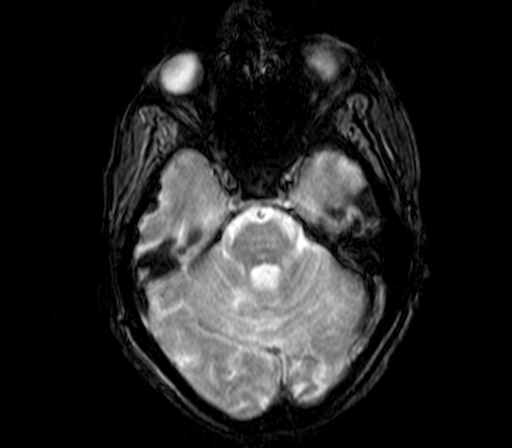
[im 15/23]
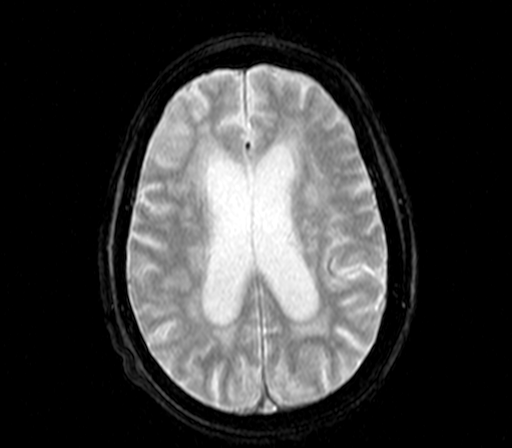
[im 23/23]
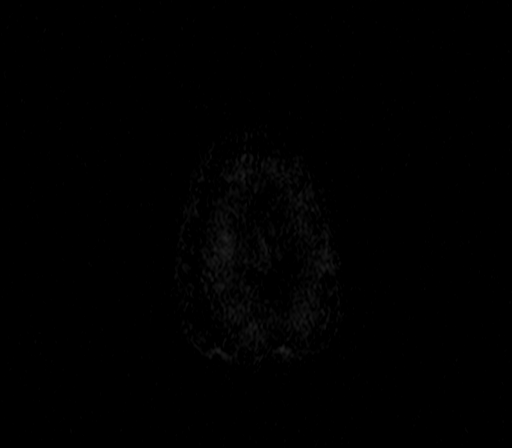

[Series 12: FLAIR · axial · 5.0mm · 0.90mm/px · z∈[-73,+76]mm · 4 of 24 slices shown]
[im 1/24]
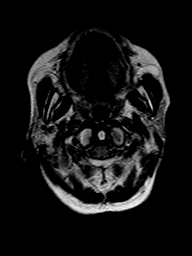
[im 8/24]
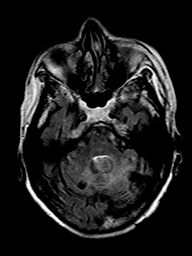
[im 16/24]
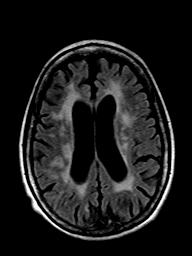
[im 24/24]
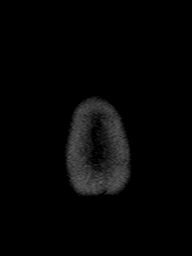

[Series 15: T2 · coronal · 5.0mm · 0.51mm/px · 4 of 29 slices shown (3 of 3)]
[im 1/29]
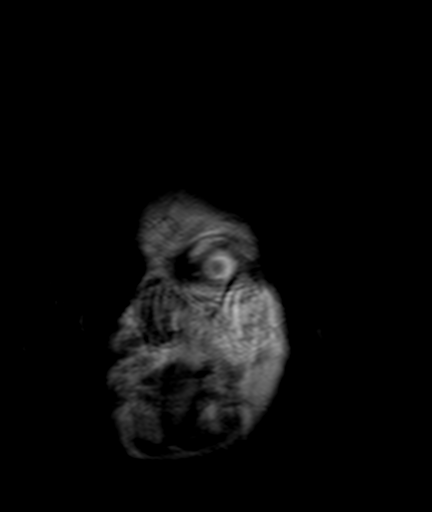
[im 10/29]
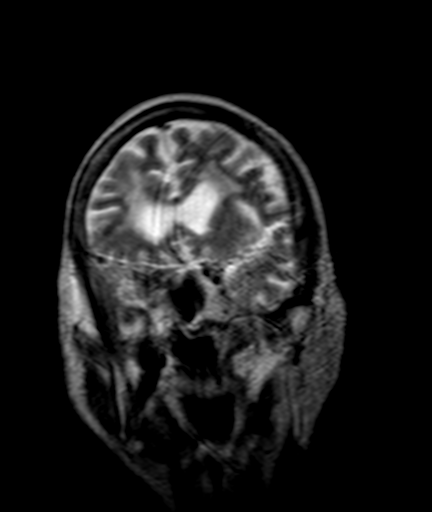
[im 19/29]
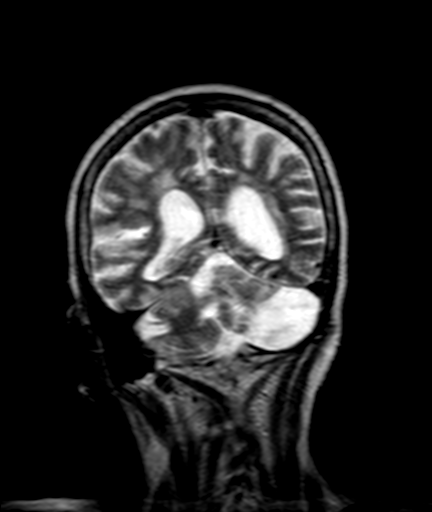
[im 29/29]
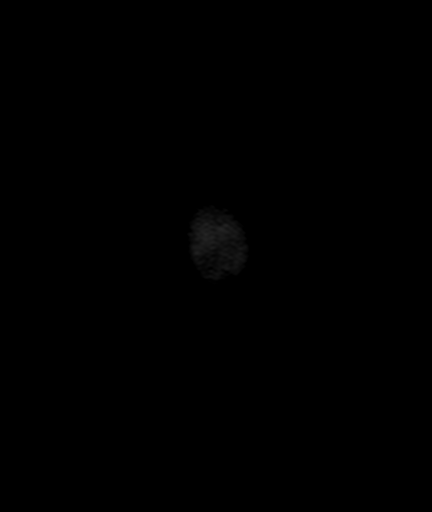

[33 of 48 positions shown; findings below may reference images not displayed]

FINDINGS: Acute infarct in the right lower parietal lobe involving cortex and
white matter. Small area of acute infarct in the right parietal
white matter superiorly. No other acute infarct.

Moderate atrophy.

Extensive chronic ischemic change. Chronic infarcts in the
cerebellum bilaterally left greater than right. Chronic ischemia
throughout the pons. Chronic infarcts in the basal ganglia
especially the left thalamus. Extensive chronic ischemic changes in
the cerebral white matter.

Chronic microhemorrhage right cerebellum, right temporal lobe, and
left parietal lobe.

Negative for mass lesion.
IMPRESSION: Acute infarct in the right lower parietal lobe. Possible embolic
infarct given the history of cardiac valve replacement and multiple
areas of chronic ischemic change.

Atrophy and advanced chronic ischemic change.
